# Patient Record
Sex: Male | Born: 1995 | Hispanic: No | Marital: Single | Smoking: Never smoker
Health system: Southern US, Community
[De-identification: ages and names within clinical notes are randomized; demographics above are authoritative.]

## PROBLEM LIST (undated history)

## (undated) DIAGNOSIS — E119 Type 2 diabetes mellitus without complications: Secondary | ICD-10-CM

## (undated) HISTORY — DX: Type 2 diabetes mellitus without complications: E11.9

---

## 2020-07-11 ENCOUNTER — Encounter (HOSPITAL_COMMUNITY): Payer: Self-pay | Admitting: Emergency Medicine

## 2020-07-11 ENCOUNTER — Other Ambulatory Visit: Payer: Self-pay

## 2020-07-11 ENCOUNTER — Inpatient Hospital Stay (HOSPITAL_COMMUNITY)
Admission: EM | Admit: 2020-07-11 | Discharge: 2020-07-15 | DRG: 638 | Disposition: A | Discharge status: Home / Self Care | Payer: Medicaid Other | Attending: Internal Medicine | Admitting: Internal Medicine

## 2020-07-11 ENCOUNTER — Emergency Department (HOSPITAL_COMMUNITY): Payer: Medicaid Other

## 2020-07-11 DIAGNOSIS — R079 Chest pain, unspecified: Secondary | ICD-10-CM

## 2020-07-11 DIAGNOSIS — E669 Obesity, unspecified: Secondary | ICD-10-CM | POA: Diagnosis present

## 2020-07-11 DIAGNOSIS — D72828 Other elevated white blood cell count: Secondary | ICD-10-CM | POA: Diagnosis present

## 2020-07-11 DIAGNOSIS — Z833 Family history of diabetes mellitus: Secondary | ICD-10-CM

## 2020-07-11 DIAGNOSIS — E1010 Type 1 diabetes mellitus with ketoacidosis without coma: Principal | ICD-10-CM | POA: Diagnosis present

## 2020-07-11 DIAGNOSIS — R3129 Other microscopic hematuria: Secondary | ICD-10-CM | POA: Diagnosis present

## 2020-07-11 DIAGNOSIS — Z6837 Body mass index (BMI) 37.0-37.9, adult: Secondary | ICD-10-CM

## 2020-07-11 DIAGNOSIS — E876 Hypokalemia: Secondary | ICD-10-CM | POA: Diagnosis present

## 2020-07-11 DIAGNOSIS — E860 Dehydration: Secondary | ICD-10-CM | POA: Diagnosis present

## 2020-07-11 DIAGNOSIS — Z8616 Personal history of COVID-19: Secondary | ICD-10-CM

## 2020-07-11 DIAGNOSIS — N179 Acute kidney failure, unspecified: Secondary | ICD-10-CM | POA: Diagnosis present

## 2020-07-11 DIAGNOSIS — E871 Hypo-osmolality and hyponatremia: Secondary | ICD-10-CM | POA: Diagnosis present

## 2020-07-11 DIAGNOSIS — E1110 Type 2 diabetes mellitus with ketoacidosis without coma: Secondary | ICD-10-CM | POA: Diagnosis present

## 2020-07-11 LAB — CBC WITH DIFFERENTIAL/PLATELET
Abs Immature Granulocytes: 0.16 10*3/uL — ABNORMAL HIGH (ref 0.00–0.07)
Basophils Absolute: 0.1 10*3/uL (ref 0.0–0.1)
Basophils Relative: 0 %
Eosinophils Absolute: 0 10*3/uL (ref 0.0–0.5)
Eosinophils Relative: 0 %
HCT: 51.8 % (ref 39.0–52.0)
Hemoglobin: 17 g/dL (ref 13.0–17.0)
Immature Granulocytes: 1 %
Lymphocytes Relative: 9 %
Lymphs Abs: 1.5 10*3/uL (ref 0.7–4.0)
MCH: 27.7 pg (ref 26.0–34.0)
MCHC: 32.8 g/dL (ref 30.0–36.0)
MCV: 84.4 fL (ref 80.0–100.0)
Monocytes Absolute: 0.8 10*3/uL (ref 0.1–1.0)
Monocytes Relative: 5 %
Neutro Abs: 14.4 10*3/uL — ABNORMAL HIGH (ref 1.7–7.7)
Neutrophils Relative %: 85 %
Platelets: 347 10*3/uL (ref 150–400)
RBC: 6.14 MIL/uL — ABNORMAL HIGH (ref 4.22–5.81)
RDW: 13.4 % (ref 11.5–15.5)
WBC: 17 10*3/uL — ABNORMAL HIGH (ref 4.0–10.5)
nRBC: 0 % (ref 0.0–0.2)

## 2020-07-11 LAB — MAGNESIUM: Magnesium: 2.6 mg/dL — ABNORMAL HIGH (ref 1.7–2.4)

## 2020-07-11 LAB — I-STAT VENOUS BLOOD GAS, ED
Acid-base deficit: 28 mmol/L — ABNORMAL HIGH (ref 0.0–2.0)
Bicarbonate: 3.4 mmol/L — ABNORMAL LOW (ref 20.0–28.0)
Calcium, Ion: 1.16 mmol/L (ref 1.15–1.40)
HCT: 55 % — ABNORMAL HIGH (ref 39.0–52.0)
Hemoglobin: 18.7 g/dL — ABNORMAL HIGH (ref 13.0–17.0)
O2 Saturation: 81 %
Potassium: 4 mmol/L (ref 3.5–5.1)
Sodium: 127 mmol/L — ABNORMAL LOW (ref 135–145)
TCO2: <5 mmol/L — ABNORMAL LOW (ref 22–32)
pCO2, Ven: 16.6 mmHg — CL (ref 44.0–60.0)
pH, Ven: 6.914 — CL (ref 7.250–7.430)
pO2, Ven: 73 mmHg — ABNORMAL HIGH (ref 32.0–45.0)

## 2020-07-11 LAB — CBC
HCT: 52.3 % — ABNORMAL HIGH (ref 39.0–52.0)
HCT: 47.3 % (ref 39.0–52.0)
Hemoglobin: 17.1 g/dL — ABNORMAL HIGH (ref 13.0–17.0)
Hemoglobin: 15.9 g/dL (ref 13.0–17.0)
MCH: 27.7 pg (ref 26.0–34.0)
MCH: 28.2 pg (ref 26.0–34.0)
MCHC: 32.7 g/dL (ref 30.0–36.0)
MCHC: 33.6 g/dL (ref 30.0–36.0)
MCV: 84.8 fL (ref 80.0–100.0)
MCV: 84 fL (ref 80.0–100.0)
Platelets: 330 10*3/uL (ref 150–400)
Platelets: 296 10*3/uL (ref 150–400)
RBC: 6.17 MIL/uL — ABNORMAL HIGH (ref 4.22–5.81)
RBC: 5.63 MIL/uL (ref 4.22–5.81)
RDW: 13.4 % (ref 11.5–15.5)
RDW: 13.2 % (ref 11.5–15.5)
WBC: 16.6 10*3/uL — ABNORMAL HIGH (ref 4.0–10.5)
WBC: 21.1 10*3/uL — ABNORMAL HIGH (ref 4.0–10.5)
nRBC: 0 % (ref 0.0–0.2)
nRBC: 0 % (ref 0.0–0.2)

## 2020-07-11 LAB — RAPID URINE DRUG SCREEN, HOSP PERFORMED
Amphetamines: NOT DETECTED
Barbiturates: NOT DETECTED
Benzodiazepines: NOT DETECTED
Cocaine: NOT DETECTED
Opiates: NOT DETECTED
Tetrahydrocannabinol: NOT DETECTED

## 2020-07-11 LAB — CBG MONITORING, ED
Glucose-Capillary: 476 mg/dL — ABNORMAL HIGH (ref 70–99)
Glucose-Capillary: 504 mg/dL (ref 70–99)
Glucose-Capillary: 531 mg/dL (ref 70–99)
Glucose-Capillary: 534 mg/dL (ref 70–99)
Glucose-Capillary: 458 mg/dL — ABNORMAL HIGH (ref 70–99)

## 2020-07-11 LAB — URINALYSIS, ROUTINE W REFLEX MICROSCOPIC
Bacteria, UA: NONE SEEN
Bilirubin Urine: NEGATIVE
Glucose, UA: >=500 mg/dL — AB
Ketones, ur: 80 mg/dL — AB
Leukocytes,Ua: NEGATIVE
Nitrite: NEGATIVE
Protein, ur: 100 mg/dL — AB
Specific Gravity, Urine: 1.017 (ref 1.005–1.030)
pH: 5 (ref 5.0–8.0)

## 2020-07-11 LAB — RESP PANEL BY RT-PCR (FLU A&B, COVID) ARPGX2
Influenza A by PCR: NEGATIVE
Influenza B by PCR: NEGATIVE
SARS Coronavirus 2 by RT PCR: NEGATIVE

## 2020-07-11 LAB — BASIC METABOLIC PANEL
BUN: 21 mg/dL — ABNORMAL HIGH (ref 6–20)
BUN: 22 mg/dL — ABNORMAL HIGH (ref 6–20)
CO2: <7 mmol/L — ABNORMAL LOW (ref 22–32)
CO2: <7 mmol/L — ABNORMAL LOW (ref 22–32)
Calcium: 8.9 mg/dL (ref 8.9–10.3)
Calcium: 9 mg/dL (ref 8.9–10.3)
Chloride: 98 mmol/L (ref 98–111)
Chloride: 99 mmol/L (ref 98–111)
Creatinine, Ser: 1.52 mg/dL — ABNORMAL HIGH (ref 0.61–1.24)
Creatinine, Ser: 1.53 mg/dL — ABNORMAL HIGH (ref 0.61–1.24)
GFR, Estimated: >60 mL/min (ref >60)
GFR, Estimated: >60 mL/min (ref >60)
Glucose, Bld: 488 mg/dL — ABNORMAL HIGH (ref 70–99)
Glucose, Bld: 506 mg/dL (ref 70–99)
Potassium: 4.2 mmol/L (ref 3.5–5.1)
Potassium: 3.8 mmol/L (ref 3.5–5.1)
Sodium: 125 mmol/L — ABNORMAL LOW (ref 135–145)
Sodium: 126 mmol/L — ABNORMAL LOW (ref 135–145)

## 2020-07-11 LAB — GLUCOSE, CAPILLARY: Glucose-Capillary: 409 mg/dL — ABNORMAL HIGH (ref 70–99)

## 2020-07-11 LAB — TSH: TSH: 0.7 u[IU]/mL (ref 0.350–4.500)

## 2020-07-11 LAB — BETA-HYDROXYBUTYRIC ACID: Beta-Hydroxybutyric Acid: >8 mmol/L — ABNORMAL HIGH (ref 0.05–0.27)

## 2020-07-11 LAB — HEMOGLOBIN A1C
Hgb A1c MFr Bld: 12.8 % — ABNORMAL HIGH (ref 4.8–5.6)
Mean Plasma Glucose: 320.66 mg/dL

## 2020-07-11 LAB — TROPONIN I (HIGH SENSITIVITY): Troponin I (High Sensitivity): 5 ng/L (ref <18)

## 2020-07-11 LAB — D-DIMER, QUANTITATIVE: D-Dimer, Quant: 0.42 ug/mL-FEU (ref 0.00–0.50)

## 2020-07-11 LAB — PHOSPHORUS: Phosphorus: 1.8 mg/dL — ABNORMAL LOW (ref 2.5–4.6)

## 2020-07-11 MED ORDER — POTASSIUM CHLORIDE 10 MEQ/100ML IV SOLN
10.0000 meq | INTRAVENOUS | Status: DC
  Administered 2020-07-11: 10 meq via INTRAVENOUS
  Filled 2020-07-11 (×2): qty 100

## 2020-07-11 MED ORDER — LACTATED RINGERS IV SOLN: INTRAVENOUS | Status: DC

## 2020-07-11 MED ORDER — INSULIN REGULAR(HUMAN) IN NACL 100-0.9 UT/100ML-% IV SOLN
INTRAVENOUS | Status: DC
  Administered 2020-07-12: 8.5 [IU]/h via INTRAVENOUS
  Administered 2020-07-12: 11.5 [IU]/h via INTRAVENOUS
  Administered 2020-07-13: 5 [IU]/h via INTRAVENOUS
  Filled 2020-07-11 (×3): qty 100

## 2020-07-11 MED ORDER — STERILE WATER FOR INJECTION IV SOLN
INTRAVENOUS | Status: DC
  Filled 2020-07-11 (×3): qty 850

## 2020-07-11 MED ORDER — ENOXAPARIN SODIUM 60 MG/0.6ML ~~LOC~~ SOLN
60.0000 mg | SUBCUTANEOUS | Status: DC
  Administered 2020-07-11 – 2020-07-12 (×2): 60 mg via SUBCUTANEOUS
  Filled 2020-07-11 (×3): qty 0.6

## 2020-07-11 MED ORDER — POTASSIUM CHLORIDE 10 MEQ/100ML IV SOLN: 10.0000 meq | INTRAVENOUS | Status: DC

## 2020-07-11 MED ORDER — LACTATED RINGERS IV BOLUS
20.0000 mL/kg | Freq: Once | INTRAVENOUS | Status: AC
  Administered 2020-07-11: 2450 mL via INTRAVENOUS

## 2020-07-11 MED ORDER — POTASSIUM CHLORIDE 10 MEQ/100ML IV SOLN
10.0000 meq | INTRAVENOUS | Status: DC
  Administered 2020-07-11: 10 meq via INTRAVENOUS
  Filled 2020-07-11: qty 100

## 2020-07-11 MED ORDER — LORAZEPAM 2 MG/ML IJ SOLN
1.0000 mg | Freq: Once | INTRAMUSCULAR | Status: AC
  Administered 2020-07-11: 1 mg via INTRAVENOUS
  Filled 2020-07-11: qty 1

## 2020-07-11 MED ORDER — DEXTROSE IN LACTATED RINGERS 5 % IV SOLN: INTRAVENOUS | Status: DC

## 2020-07-11 MED ORDER — INSULIN REGULAR(HUMAN) IN NACL 100-0.9 UT/100ML-% IV SOLN
INTRAVENOUS | Status: DC
  Administered 2020-07-11: 6.5 [IU]/h via INTRAVENOUS
  Filled 2020-07-11: qty 100

## 2020-07-11 MED ORDER — DEXTROSE 50 % IV SOLN: 0.0000 mL | INTRAVENOUS | Status: DC | PRN

## 2020-07-11 MED ORDER — SODIUM CHLORIDE 0.9 % IV SOLN
1.0000 g | INTRAVENOUS | Status: DC
  Administered 2020-07-12: 1 g via INTRAVENOUS
  Filled 2020-07-11 (×3): qty 10

## 2020-07-11 MED ORDER — DEXTROSE 50 % IV SOLN
0.0000 mL | INTRAVENOUS | Status: DC | PRN
Start: 2020-07-11 — End: 2020-07-11
  Administered 2020-07-11: 50 mL via INTRAVENOUS
  Filled 2020-07-11: qty 50

## 2020-07-11 MED ORDER — SODIUM CHLORIDE 0.9 % IV BOLUS
1000.0000 mL | Freq: Once | INTRAVENOUS | Status: AC
  Administered 2020-07-11: 1000 mL via INTRAVENOUS

## 2020-07-11 MED ORDER — CHLORHEXIDINE GLUCONATE CLOTH 2 % EX PADS
6.0000 | MEDICATED_PAD | Freq: Every day | CUTANEOUS | Status: DC
  Administered 2020-07-12 – 2020-07-15 (×4): 6 via TOPICAL

## 2020-07-11 NOTE — ED Triage Notes (Addendum)
Pt reports pain to center of chest and SOB x 4 days.  Quit drinking heavy ETOH 22 days ago.  Pt tachypnic and tachycardic @ 140.Marland Kitchen  Skin mottled.  Oral and axillary temp not reading at triage.

## 2020-07-11 NOTE — Progress Notes (Addendum)
Per ED RN, patient received two bags of potassium. There was an ER order for two bags to be given, however, CCM placed an order for four bags to be given. ED RN scanned the first bag in under the ER order set (1 out of 2) and the second bag from the CCM order set (1 out of 4).   This lead to confusion on how many total bags of potassium the patient was suppose to receive. After calling [facility], I spoke with Marlane Mingle, MD in regards to the orders. Per Marlane Mingle, MD, "only give two bags of potassium at this time, and then follow up based on BMP results".

## 2020-07-11 NOTE — ED Provider Notes (Signed)
MSE was initiated and I personally evaluated the patient and placed orders (if any) at  6:15 PM on July 11, 2020.  The patient appears stable so that the remainder of the MSE may be completed by another provider.  Patient report history of heavy alcohol use, quit 22 days ago, report 4 days of recurrent chest pain shortness of breath.  Denies any drug use.  Denies any history of thyroid disease.  Patient appears anxious, skin is mottled, he is tachycardic.   Fayrene Helper, PA-C 07/11/20 1817    Koleen Distance, MD 07/11/20 [phone]

## 2020-07-11 NOTE — ED Provider Notes (Signed)
[facility]   CSN: [id number] Arrival date & time: 07/11/20  1759     History Chief Complaint  Patient presents with  . Chest Pain  . Shortness of Breath    Ian Carrillo is a 25 y.o. male.  HPI Patient is a 25 year old male presented today significantly short of breath and dyspneic.  He was breathing rapidly and unable to provide more than brief 1-5 word sentences.  He communicates that he is having some chest tightness and pressure, feeling very short of breath he states that his symptoms have been ongoing for approximately 1 week became severely rapidly worse over the past several hours.  Level 5 caveat due to acuity of condition  I discussed with patient's partner who states the patient has been drinking large amounts of water and peeing frequently for the past week or 2.  No history of diabetes in the family.  Patient denies any fevers, chills, lightheadedness or dizziness.  He does not for me that he quit drinking heavy alcohol approximately 22 days ago.  He had some mild withdrawal symptoms of anxiety and ill temper but seems to have improved.    History reviewed. No pertinent past medical history.  Patient Active Problem List   Diagnosis Date Noted  . DKA (diabetic ketoacidosis) (HCC) 07/11/2020    History reviewed. No pertinent surgical history.     No family history on file.  Social History   Tobacco Use  . Smoking status: Never Smoker  . Smokeless tobacco: Never Used  Substance Use Topics  . Alcohol use: Yes    Comment: quit 22 days ago  . Drug use: Not Currently    Home Medications Prior to Admission medications   Not on File    Allergies    Patient has no known allergies.  Review of Systems   Review of Systems  Unable to perform ROS: Severe respiratory distress  Constitutional: Positive for fatigue. Negative for chills and fever.  HENT: Negative for congestion.   Eyes: Negative for pain.   Respiratory: Positive for chest tightness and shortness of breath. Negative for cough.   Cardiovascular: Negative for chest pain and leg swelling.  Gastrointestinal: Negative for abdominal pain and vomiting.  Genitourinary: Positive for frequency.  Musculoskeletal: Negative for myalgias.  Skin: Negative for rash.  Neurological: Negative for dizziness and headaches.    Physical Exam Updated Vital Signs BP (!) 154/94   Pulse (!) 122   Temp (!) 97.4 F (36.3 C) (Oral)   Resp (!) 27   Ht 5\' 7"  (1.702 m)   Wt 122.5 kg   SpO2 100%   BMI 42.29 kg/m   Physical Exam Vitals and nursing note reviewed.  Constitutional:      General: He is in acute distress.     Appearance: He is ill-appearing, toxic-appearing and diaphoretic.     Comments: Patient is alert obese 25 year old male is rapidly breathing shallowly.  Increased work of breathing.  Significant tachypnea rate approximately 40-42  HENT:     Head: Normocephalic and atraumatic.     Nose: Nose normal.     Mouth/Throat:     Mouth: Mucous membranes are dry.     Comments: Mouth is severely dry.  Oral mucosa is dry to touch. Eyes:     General: No scleral icterus. Cardiovascular:     Rate and Rhythm: Normal rate and regular rhythm.     Pulses: Normal pulses.     Heart sounds: Normal  heart sounds.  Pulmonary:     Effort: Pulmonary effort is normal. No respiratory distress.     Breath sounds: Normal breath sounds. No wheezing.     Comments: Lungs are clear to auscultation all fields.  No wheezes or rhonchi.  Good air movement.  Tachypnea. Abdominal:     Palpations: Abdomen is soft.     Tenderness: There is no abdominal tenderness. There is no right CVA tenderness, left CVA tenderness, guarding or rebound.     Hernia: No hernia is present.  Musculoskeletal:     Cervical back: Normal range of motion.     Right lower leg: No edema.     Left lower leg: No edema.  Skin:    General: Skin is warm.     Capillary Refill: Capillary  refill takes less than 2 seconds.  Neurological:     Mental Status: He is alert. Mental status is at baseline.  Psychiatric:        Mood and Affect: Mood normal.        Behavior: Behavior normal.     ED Results / Procedures / Treatments   Labs (all labs ordered are listed, but only abnormal results are displayed) Labs Reviewed  BASIC METABOLIC PANEL - Abnormal; Notable for the following components:      Result Value   Sodium 125 (*)    CO2 <7 (*)    Glucose, Bld 488 (*)    BUN 21 (*)    Creatinine, Ser 1.52 (*)    All other components within normal limits  CBC - Abnormal; Notable for the following components:   WBC 16.6 (*)    RBC 6.17 (*)    Hemoglobin 17.1 (*)    HCT 52.3 (*)    All other components within normal limits  BETA-HYDROXYBUTYRIC ACID - Abnormal; Notable for the following components:   Beta-Hydroxybutyric Acid >8.00 (*)    All other components within normal limits  BASIC METABOLIC PANEL - Abnormal; Notable for the following components:   Sodium 126 (*)    CO2 <7 (*)    Glucose, Bld 506 (*)    BUN 22 (*)    Creatinine, Ser 1.53 (*)    All other components within normal limits  CBC WITH DIFFERENTIAL/PLATELET - Abnormal; Notable for the following components:   WBC 17.0 (*)    RBC 6.14 (*)    Neutro Abs 14.4 (*)    Abs Immature Granulocytes 0.16 (*)    All other components within normal limits  URINALYSIS, ROUTINE W REFLEX MICROSCOPIC - Abnormal; Notable for the following components:   Color, Urine STRAW (*)    Glucose, UA >=500 (*)    Hgb urine dipstick MODERATE (*)    Ketones, ur 80 (*)    Protein, ur 100 (*)    All other components within normal limits  CBC - Abnormal; Notable for the following components:   WBC 21.1 (*)    All other components within normal limits  BASIC METABOLIC PANEL - Abnormal; Notable for the following components:   Sodium 133 (*)    CO2 <7 (*)    Glucose, Bld 380 (*)    BUN 21 (*)    Creatinine, Ser 1.34 (*)    Calcium 8.2  (*)    All other components within normal limits  BETA-HYDROXYBUTYRIC ACID - Abnormal; Notable for the following components:   Beta-Hydroxybutyric Acid >8.00 (*)    All other components within normal limits  HEMOGLOBIN A1C - Abnormal; Notable for  the following components:   Hgb A1c MFr Bld 12.8 (*)    All other components within normal limits  LACTIC ACID, PLASMA - Abnormal; Notable for the following components:   Lactic Acid, Venous 3.2 (*)    All other components within normal limits  MAGNESIUM - Abnormal; Notable for the following components:   Magnesium 2.6 (*)    All other components within normal limits  PHOSPHORUS - Abnormal; Notable for the following components:   Phosphorus 1.8 (*)    All other components within normal limits  GLUCOSE, CAPILLARY - Abnormal; Notable for the following components:   Glucose-Capillary 409 (*)    All other components within normal limits  I-STAT VENOUS BLOOD GAS, ED - Abnormal; Notable for the following components:   pH, Ven 6.914 (*)    pCO2, Ven 16.6 (*)    pO2, Ven 73.0 (*)    Bicarbonate 3.4 (*)    TCO2 <5 (*)    Acid-base deficit 28.0 (*)    Sodium 127 (*)    HCT 55.0 (*)    Hemoglobin 18.7 (*)    All other components within normal limits  CBG MONITORING, ED - Abnormal; Notable for the following components:   Glucose-Capillary 476 (*)    All other components within normal limits  CBG MONITORING, ED - Abnormal; Notable for the following components:   Glucose-Capillary 504 (*)    All other components within normal limits  CBG MONITORING, ED - Abnormal; Notable for the following components:   Glucose-Capillary 531 (*)    All other components within normal limits  CBG MONITORING, ED - Abnormal; Notable for the following components:   Glucose-Capillary 534 (*)    All other components within normal limits  CBG MONITORING, ED - Abnormal; Notable for the following components:   Glucose-Capillary 458 (*)    All other components within  normal limits  RESP PANEL BY RT-PCR (FLU A&B, COVID) ARPGX2  MRSA PCR SCREENING  D-DIMER, QUANTITATIVE  RAPID URINE DRUG SCREEN, HOSP PERFORMED  TSH  BLOOD GAS, VENOUS  BASIC METABOLIC PANEL  BASIC METABOLIC PANEL  BETA-HYDROXYBUTYRIC ACID  HIV ANTIBODY (ROUTINE TESTING W REFLEX)  BASIC METABOLIC PANEL  BASIC METABOLIC PANEL  BASIC METABOLIC PANEL  BETA-HYDROXYBUTYRIC ACID  LACTIC ACID, PLASMA  BASIC METABOLIC PANEL  BETA-HYDROXYBUTYRIC ACID  BASIC METABOLIC PANEL  BETA-HYDROXYBUTYRIC ACID  I-STAT VENOUS BLOOD GAS, ED  TROPONIN I (HIGH SENSITIVITY)  TROPONIN I (HIGH SENSITIVITY)    EKG None  Radiology DG Chest 2 View  Result Date: 07/11/2020 CLINICAL DATA:  Chest pain.  Shortness of breath. EXAM: CHEST - 2 VIEW COMPARISON:  None. FINDINGS: The heart size and mediastinal contours are within normal limits. Both lungs are clear. No visible pleural effusions or pneumothorax. No acute osseous abnormality. IMPRESSION: No active cardiopulmonary disease. Electronically Signed   By: Feliberto Harts MD   On: 07/11/2020 18:29    Procedures .Critical Care Performed by: Gailen Shelter, PA Authorized by: Gailen Shelter, PA   Critical care provider statement:    Critical care time (minutes):  45   Critical care was necessary to treat or prevent imminent or life-threatening deterioration of the following conditions:  Metabolic crisis   Critical care was time spent personally by me on the following activities:  Discussions with consultants, evaluation of patient's response to treatment, examination of patient, ordering and performing treatments and interventions, ordering and review of laboratory studies, ordering and review of radiographic studies, pulse oximetry, re-evaluation of patient's condition, obtaining  history from patient or surrogate and review of old charts     Medications Ordered in ED Medications  lactated ringers infusion (0 mLs Intravenous Stopped 07/11/20 2250)   dextrose 5 % in lactated ringers infusion ( Intravenous Stopped 07/11/20 2024)  sodium bicarbonate 150 mEq in sterile water 1,000 mL infusion ( Intravenous Infusion Verify 07/11/20 2300)  enoxaparin (LOVENOX) injection 60 mg (60 mg Subcutaneous Given 07/11/20 2144)  insulin regular, human (MYXREDLIN) 100 units/ 100 mL infusion ( Intravenous Infusion Verify 07/11/20 2300)  lactated ringers infusion ( Intravenous New Bag/Given 07/11/20 2355)  dextrose 5 % in lactated ringers infusion (has no administration in time range)  dextrose 50 % solution 0-50 mL (has no administration in time range)  cefTRIAXone (ROCEPHIN) 1 g in sodium chloride 0.9 % 100 mL IVPB (has no administration in time range)  Chlorhexidine Gluconate Cloth 2 % PADS 6 each (has no administration in time range)  LORazepam (ATIVAN) injection 1 mg (1 mg Intravenous Given 07/11/20 1859)  sodium chloride 0.9 % bolus 1,000 mL (1,000 mLs Intravenous New Bag/Given 07/11/20 1900)  lactated ringers bolus 2,450 mL (2,450 mLs Intravenous New Bag/Given 07/11/20 1949)    ED Course  I have reviewed the triage vital signs and the nursing notes.  Pertinent labs & imaging results that were available during my care of the patient were reviewed by me and considered in my medical decision making (see chart for details).  Patient is 25 year old male severely ill toxic appearing and tachypneic, tachycardic with very dry oral mucosa and using accessory muscle usage with difficulty speaking due to his dyspnea.  Patient has had lab work obtained in triage.  D-dimer is within normal limits.  This makes my clinical suspicion for PE relatively low--although this is certainly a consideration given his rapid breathing clear lungs and tachycardia.  Patient has dry oral mucosa and Kussmaul breathing will obtain CBG.  CBG significantly elevated.  Suspect DKA.  Will provide patient with 1 L normal saline bolus.  As soon as potassium results will initiate insulin.    Clinical  Course as of 07/12/20 0022  Sat Jul 11, 2020  1841 Discussed with patient's fianc Crystal who states the patient has been drinking lots of water and urinating frequently I have very high suspicion for DKA.  Will obtain CBG.  CBG elevated at 476.  Patient has Kussmaul breathing.  Will provide with IV fluid boluses and eval K+ before insulin initiation  [WF]  2002 Discussed with PCCM who will evaluate pt at bedside.  [WF]    Clinical Course User Index [WF] Gailen Shelter, PA   CBG notable for appropriate hyponatremia given his hyperglycemia.  BUN and creatinine are elevated likely from significant dehydration.  Bicarb on BMP is undetectably low.  Anion gap not calculated.  I personally calculated bicarb to be at least 20 given that bicarb is less than 7 I calculated it as if bicarb was 7.  CBC with leukocytosis likely from dehydration given that there is erythrocytosis as well.  TSH within normal limits.  Troponin within normal limits.  D-dimer unremarkable and within normal limits.  Covid test negative pH is 6.9 will initiate bicarb.  I discussed the case my attending physician who is agreeable with plan.  Urine drug screen negative for recreational drugs.  Urinalysis with ketones moderate hemoglobin perhaps due to stress.  Glucosuria  Consulted PCCM who will admit.  MDM Rules/Calculators/A&P  Final Clinical Impression(s) / ED Diagnoses Final diagnoses:  Diabetic ketoacidosis without coma associated with type 1 diabetes mellitus [facility])    Rx / DC Orders ED Discharge Orders    None       Gailen Shelter, [place] 07/12/20 0022    Cheryll Cockayne, MD 07/12/20 1527

## 2020-07-11 NOTE — H&P (Signed)
NAME:  Ian Carrillo, MRN:  [id number], DOB:  [birth year], LOS: 0 ADMISSION DATE:  07/11/2020,   History of Present Illness:  This is a 25 year old male that presented to the emergency room from home.  He presented with significant dyspnea/shortness of breath and generalized lethargy.  Patient's had significant polydipsia and polyuria for approximately 20 days.  Over the last 10 days he has lost approximately 10 pounds in his eaten very little over the past 3 days.  22 days ago the patient stopped drinking all alcohol and had been drinking a significant amount on a daily basis.  Fifth of alcohol would last approximately 2 to 3 days.  Patient denies any fever sweats or chills.  No cough, sore throat, rhinorrhea, nausea, vomiting, diarrhea.  Positive for generalized lethargy and fatigue.  The patient has a family history of his maternal grandmother is type I diabetic.  Patient did have Covid in December 2021.  Pertinent  Medical History  None  Significant Hospital Events: Including procedures, antibiotic start and stop dates in addition to other pertinent events   .   Interim History / Subjective:    Objective   Blood pressure (!) 159/103, pulse (!) 125, resp. rate (!) 28, height 5\' 7"  (1.702 m), weight 122.5 kg, SpO2 100 %.       No intake or output data in the 24 hours ending 07/11/20 2115 Filed Weights   07/11/20 1902  Weight: 122.5 kg    Examination: General: Moderate tachypnea.  Generalized lethargy. HENT: Atraumatic/normocephalic mucous membranes are very dry. Lungs: Clear to auscultation bilaterally.  No wheezing rales or rhonchi noted. Cardiovascular: Regular rate mildly tachycardic.   Abdomen: Soft, nontender, not distended, no rebound/rigidity/guarding. Extremities: Distal pulse intact x4.  No significant edema.  No cyanosis.  Patient does have a mottling type rash of the inner thighs bilaterally.  This is nonblanchable. Neuro: Patient is lethargic but answers questions  appropriately.  Generally weak.  But no focal neurologic deficits.   Labs/imaging that I havepersonally reviewed  (right click and "Reselect all SmartList Selections" daily)    Resolved Hospital Problem list     Assessment & Plan:  DKA New diagnosed type I diabetic  Plan: Patient be admitted to the intensive care unit for further work-up. DKA order set is be initiated. Insulin infusion started. Strictly n.p.o. Patient is severely acidotic and symptomatic.  A sodium bicarbonate infusion has been started.  Patient did receive 2.4 L LR bolus and will be continued on LR infusion. Monitor I's/O's. Covid testing is negative.  Discussed the case with patient's mother and girlfriend at the bedside.  Best practice (right click and "Reselect all SmartList Selections" daily)  Diet:  NPO Pain/Anxiety/Delirium protocol (if indicated): No VAP protocol (if indicated): Not indicated DVT prophylaxis: LMWH GI prophylaxis: PPI-Protonix Glucose control: DKA order set Central venous access:  N/A Arterial line:  N/A Foley:  N/A Mobility:  bed rest  PT consulted: N/A Last date of multidisciplinary goals of care discussion [ Code Status:  full code Disposition: Admit to the intensive care unit  Labs    CBC: Recent Labs  Lab 07/11/20 1820 07/11/20 1853 07/11/20 1909  WBC 16.6* 17.0*  --   NEUTROABS  --  14.4*  --   HGB 17.1* 17.0 18.7*  HCT 52.3* 51.8 55.0*  MCV 84.8 84.4  --   PLT 330 347  --     Basic Metabolic Panel: Recent Labs  Lab 07/11/20 1820 07/11/20 1853 07/11/20 1909  NA 125*  126* 127*  K 4.2 3.8 4.0  CL 98 99  --   CO2 <7* <7*  --   GLUCOSE 488* 506*  --   BUN 21* 22*  --   CREATININE 1.52* 1.53*  --   CALCIUM 8.9 9.0  --    GFR: Estimated Creatinine Clearance: 94.2 mL/min (A) (by C-G formula based on SCr of 1.53 mg/dL (H)). Recent Labs  Lab 07/11/20 1820 07/11/20 1853  WBC 16.6* 17.0*    Liver Function Tests: No results for input(s): AST, ALT,  ALKPHOS, BILITOT, PROT, ALBUMIN in the last 168 hours. No results for input(s): LIPASE, AMYLASE in the last 168 hours. No results for input(s): AMMONIA in the last 168 hours.  ABG    Component Value Date/Time   HCO3 3.4 (L) 07/11/2020 1909   TCO2 <5 (L) 07/11/2020 1909   ACIDBASEDEF 28.0 (H) 07/11/2020 1909   O2SAT 81.0 07/11/2020 1909     Coagulation Profile: No results for input(s): INR, PROTIME in the last 168 hours.  Cardiac Enzymes: No results for input(s): CKTOTAL, CKMB, CKMBINDEX, TROPONINI in the last 168 hours.  HbA1C: No results found for: HGBA1C  CBG: Recent Labs  Lab 07/11/20 1853 07/11/20 1957 07/11/20 2032 07/11/20 2109  GLUCAP 476* 504* 531* 534*    Review of Systems:   See HPI  Past Medical History:  He,  has no past medical history on file.   Surgical History:  History reviewed. No pertinent surgical history.   Social History:   reports that he has never smoked. He has never used smokeless tobacco. He reports current alcohol use. He reports previous drug use.   Family History:  His family history is not on file.   Allergies Not on File   Home Medications  Prior to Admission medications   Not on File     Critical care time: 

## 2020-07-11 NOTE — Progress Notes (Signed)
eLink Physician-Brief Progress Note Patient Name: Ian Carrillo DOB: [birth year] MRN: [id number]   Date of Service  07/11/2020  HPI/Events of Note  DKA On insulin and bicarb gtt. Seen by Dr Nicole Cella already. Kcl replaced by ED . Has Kcl 10 meq x q1 hr x 4 doses. Last K 4.7  eICU Interventions  - follow BMP. DC ed 38m eq Kcl order for now. If hypokalemia to call back for replacement      Intervention Category Intermediate Interventions: Electrolyte abnormality - evaluation and management  Ranee Gosselin 07/11/2020, 11:18 PM

## 2020-07-11 NOTE — ED Notes (Signed)
This RN attempted to call report x1  

## 2020-07-12 ENCOUNTER — Inpatient Hospital Stay: Payer: Self-pay

## 2020-07-12 DIAGNOSIS — E1010 Type 1 diabetes mellitus with ketoacidosis without coma: Principal | ICD-10-CM

## 2020-07-12 DIAGNOSIS — E876 Hypokalemia: Secondary | ICD-10-CM

## 2020-07-12 DIAGNOSIS — E872 Acidosis: Secondary | ICD-10-CM

## 2020-07-12 DIAGNOSIS — N179 Acute kidney failure, unspecified: Secondary | ICD-10-CM

## 2020-07-12 LAB — BASIC METABOLIC PANEL
Anion gap: 13 (ref 5–15)
Anion gap: 12 (ref 5–15)
Anion gap: 13 (ref 5–15)
BUN: 21 mg/dL — ABNORMAL HIGH (ref 6–20)
BUN: 18 mg/dL (ref 6–20)
BUN: 11 mg/dL (ref 6–20)
BUN: 11 mg/dL (ref 6–20)
BUN: 12 mg/dL (ref 6–20)
CO2: <7 mmol/L — ABNORMAL LOW (ref 22–32)
CO2: <7 mmol/L — ABNORMAL LOW (ref 22–32)
CO2: 11 mmol/L — ABNORMAL LOW (ref 22–32)
CO2: 16 mmol/L — ABNORMAL LOW (ref 22–32)
CO2: 17 mmol/L — ABNORMAL LOW (ref 22–32)
Calcium: 8.2 mg/dL — ABNORMAL LOW (ref 8.9–10.3)
Calcium: 8.4 mg/dL — ABNORMAL LOW (ref 8.9–10.3)
Calcium: 8.5 mg/dL — ABNORMAL LOW (ref 8.9–10.3)
Calcium: 8.3 mg/dL — ABNORMAL LOW (ref 8.9–10.3)
Calcium: 8.7 mg/dL — ABNORMAL LOW (ref 8.9–10.3)
Chloride: 110 mmol/L (ref 98–111)
Chloride: 111 mmol/L (ref 98–111)
Chloride: 110 mmol/L (ref 98–111)
Chloride: 109 mmol/L (ref 98–111)
Chloride: 107 mmol/L (ref 98–111)
Creatinine, Ser: 1.34 mg/dL — ABNORMAL HIGH (ref 0.61–1.24)
Creatinine, Ser: 1.15 mg/dL (ref 0.61–1.24)
Creatinine, Ser: 1.08 mg/dL (ref 0.61–1.24)
Creatinine, Ser: 0.99 mg/dL (ref 0.61–1.24)
Creatinine, Ser: 1 mg/dL (ref 0.61–1.24)
GFR, Estimated: >60 mL/min (ref >60)
GFR, Estimated: >60 mL/min (ref >60)
GFR, Estimated: >60 mL/min (ref >60)
GFR, Estimated: >60 mL/min (ref >60)
GFR, Estimated: >60 mL/min (ref >60)
Glucose, Bld: 380 mg/dL — ABNORMAL HIGH (ref 70–99)
Glucose, Bld: 145 mg/dL — ABNORMAL HIGH (ref 70–99)
Glucose, Bld: 221 mg/dL — ABNORMAL HIGH (ref 70–99)
Glucose, Bld: 271 mg/dL — ABNORMAL HIGH (ref 70–99)
Glucose, Bld: 194 mg/dL — ABNORMAL HIGH (ref 70–99)
Potassium: 3.6 mmol/L (ref 3.5–5.1)
Potassium: 3.5 mmol/L (ref 3.5–5.1)
Potassium: 2.2 mmol/L — CL (ref 3.5–5.1)
Potassium: 2.2 mmol/L — CL (ref 3.5–5.1)
Potassium: 2 mmol/L — CL (ref 3.5–5.1)
Sodium: 133 mmol/L — ABNORMAL LOW (ref 135–145)
Sodium: 136 mmol/L (ref 135–145)
Sodium: 134 mmol/L — ABNORMAL LOW (ref 135–145)
Sodium: 137 mmol/L (ref 135–145)
Sodium: 137 mmol/L (ref 135–145)

## 2020-07-12 LAB — GLUCOSE, CAPILLARY
Glucose-Capillary: 145 mg/dL — ABNORMAL HIGH (ref 70–99)
Glucose-Capillary: 151 mg/dL — ABNORMAL HIGH (ref 70–99)
Glucose-Capillary: 153 mg/dL — ABNORMAL HIGH (ref 70–99)
Glucose-Capillary: 157 mg/dL — ABNORMAL HIGH (ref 70–99)
Glucose-Capillary: 163 mg/dL — ABNORMAL HIGH (ref 70–99)
Glucose-Capillary: 167 mg/dL — ABNORMAL HIGH (ref 70–99)
Glucose-Capillary: 169 mg/dL — ABNORMAL HIGH (ref 70–99)
Glucose-Capillary: 169 mg/dL — ABNORMAL HIGH (ref 70–99)
Glucose-Capillary: 181 mg/dL — ABNORMAL HIGH (ref 70–99)
Glucose-Capillary: 185 mg/dL — ABNORMAL HIGH (ref 70–99)
Glucose-Capillary: 192 mg/dL — ABNORMAL HIGH (ref 70–99)
Glucose-Capillary: 198 mg/dL — ABNORMAL HIGH (ref 70–99)
Glucose-Capillary: 215 mg/dL — ABNORMAL HIGH (ref 70–99)
Glucose-Capillary: 216 mg/dL — ABNORMAL HIGH (ref 70–99)
Glucose-Capillary: 220 mg/dL — ABNORMAL HIGH (ref 70–99)
Glucose-Capillary: 221 mg/dL — ABNORMAL HIGH (ref 70–99)
Glucose-Capillary: 223 mg/dL — ABNORMAL HIGH (ref 70–99)
Glucose-Capillary: 227 mg/dL — ABNORMAL HIGH (ref 70–99)
Glucose-Capillary: 239 mg/dL — ABNORMAL HIGH (ref 70–99)
Glucose-Capillary: 242 mg/dL — ABNORMAL HIGH (ref 70–99)
Glucose-Capillary: 306 mg/dL — ABNORMAL HIGH (ref 70–99)
Glucose-Capillary: 347 mg/dL — ABNORMAL HIGH (ref 70–99)

## 2020-07-12 LAB — BETA-HYDROXYBUTYRIC ACID
Beta-Hydroxybutyric Acid: >8 mmol/L — ABNORMAL HIGH (ref 0.05–0.27)
Beta-Hydroxybutyric Acid: 7.74 mmol/L — ABNORMAL HIGH (ref 0.05–0.27)
Beta-Hydroxybutyric Acid: 5.21 mmol/L — ABNORMAL HIGH (ref 0.05–0.27)

## 2020-07-12 LAB — LACTIC ACID, PLASMA
Lactic Acid, Venous: 3.2 mmol/L (ref 0.5–1.9)
Lactic Acid, Venous: 1.8 mmol/L (ref 0.5–1.9)
Lactic Acid, Venous: 1.5 mmol/L (ref 0.5–1.9)

## 2020-07-12 LAB — MAGNESIUM: Magnesium: 2.3 mg/dL (ref 1.7–2.4)

## 2020-07-12 LAB — HIV ANTIBODY (ROUTINE TESTING W REFLEX): HIV Screen 4th Generation wRfx: NONREACTIVE

## 2020-07-12 LAB — PHOSPHORUS: Phosphorus: <1 mg/dL — CL (ref 2.5–4.6)

## 2020-07-12 LAB — TROPONIN I (HIGH SENSITIVITY): Troponin I (High Sensitivity): 5 ng/L (ref <18)

## 2020-07-12 LAB — MRSA PCR SCREENING: MRSA by PCR: NEGATIVE

## 2020-07-12 MED ORDER — SODIUM CHLORIDE 0.9% FLUSH
10.0000 mL | Freq: Two times a day (BID) | INTRAVENOUS | Status: DC
  Administered 2020-07-12 – 2020-07-13 (×4): 10 mL
  Administered 2020-07-14: 3 mL
  Administered 2020-07-14 – 2020-07-15 (×2): 10 mL

## 2020-07-12 MED ORDER — POTASSIUM CHLORIDE 10 MEQ/100ML IV SOLN: 10.0000 meq | Freq: Once | INTRAVENOUS | Status: DC

## 2020-07-12 MED ORDER — POTASSIUM CHLORIDE CRYS ER 20 MEQ PO TBCR
40.0000 meq | EXTENDED_RELEASE_TABLET | ORAL | Status: DC
  Filled 2020-07-12: qty 2

## 2020-07-12 MED ORDER — POTASSIUM CHLORIDE 10 MEQ/50ML IV SOLN
10.0000 meq | INTRAVENOUS | Status: AC
  Administered 2020-07-12 – 2020-07-13 (×8): 10 meq via INTRAVENOUS
  Filled 2020-07-12 (×6): qty 50

## 2020-07-12 MED ORDER — INSULIN ASPART 100 UNIT/ML ~~LOC~~ SOLN: 0.0000 [IU] | Freq: Every day | SUBCUTANEOUS | Status: DC

## 2020-07-12 MED ORDER — INSULIN ASPART 100 UNIT/ML ~~LOC~~ SOLN: 0.0000 [IU] | Freq: Three times a day (TID) | SUBCUTANEOUS | Status: DC

## 2020-07-12 MED ORDER — POTASSIUM CHLORIDE 10 MEQ/100ML IV SOLN: 10.0000 meq | INTRAVENOUS | Status: DC

## 2020-07-12 MED ORDER — ONDANSETRON HCL 4 MG/2ML IJ SOLN: 4.0000 mg | Freq: Four times a day (QID) | INTRAMUSCULAR | Status: DC | PRN

## 2020-07-12 MED ORDER — INSULIN GLARGINE 100 UNIT/ML ~~LOC~~ SOLN
25.0000 [IU] | Freq: Every day | SUBCUTANEOUS | Status: DC
  Administered 2020-07-12: 25 [IU] via SUBCUTANEOUS
  Filled 2020-07-12 (×2): qty 0.25

## 2020-07-12 MED ORDER — POTASSIUM CHLORIDE CRYS ER 20 MEQ PO TBCR
40.0000 meq | EXTENDED_RELEASE_TABLET | ORAL | Status: DC
  Administered 2020-07-12: 40 meq via ORAL

## 2020-07-12 MED ORDER — POTASSIUM PHOSPHATES 15 MMOLE/5ML IV SOLN
20.0000 mmol | Freq: Once | INTRAVENOUS | Status: AC
  Administered 2020-07-12: 20 mmol via INTRAVENOUS
  Filled 2020-07-12: qty 6.67

## 2020-07-12 MED ORDER — SODIUM CHLORIDE 0.9% FLUSH
10.0000 mL | INTRAVENOUS | Status: DC | PRN
Start: 2020-07-12 — End: 2020-07-15

## 2020-07-12 MED ORDER — POTASSIUM CHLORIDE 10 MEQ/100ML IV SOLN
10.0000 meq | INTRAVENOUS | Status: DC
  Administered 2020-07-12: 10 meq via INTRAVENOUS
  Filled 2020-07-12: qty 100

## 2020-07-12 MED ORDER — POTASSIUM PHOSPHATES 15 MMOLE/5ML IV SOLN
30.0000 mmol | Freq: Once | INTRAVENOUS | Status: AC
  Administered 2020-07-12: 30 mmol via INTRAVENOUS
  Filled 2020-07-12: qty 10

## 2020-07-12 MED ORDER — POTASSIUM CHLORIDE 10 MEQ/100ML IV SOLN
10.0000 meq | INTRAVENOUS | Status: AC
  Administered 2020-07-12 (×4): 10 meq via INTRAVENOUS
  Filled 2020-07-12 (×3): qty 100

## 2020-07-12 NOTE — Progress Notes (Signed)
Shift Summary: q1h BG via [employer], attempted to eat and transition to subq insulin, resulting in emesis and remaining on IV insulin. PICC placement.   N: AAOx4, sleepy, MAE, afebrile.   R: RA, clear lungs  CV: ST 110-120 with improving amounts of ectopy, very frequent PVC and PAC this AM. BP WDL, no pressors.   GI: NPO for majority of shift. Insulin requirements decreasing with low BP this shift at 145. D5LR discontinued and 25 units Glargine given in attempt to transition off IV insulin. Given dinner, near immediate emesis and unable to tolerate even clear liquids. D5LR resumed and will remain on IV insulin overnight. Bicarb gtt discontinued this afternoon.   GU: no foley, uses urinal independently with 1100 UOP this shift.   Skin: clammy, no skin breakdown.   K+ given IVPB this AM for 2.2. Repeat K+ 2.2, PO K+ ordered but unable to tolerate PO. MD notified to switch back to IVPB replacement, will hand off replacement to [facility] RN.   in D5 of K Phos replaced x2 for phosphorus <1.0.   PICC line placed by vascular management/IV team this afternoon for infusion management and unable to obtain labs from peripheral sticks.   Sister and Fiance visiting bedside, mother updated via phone.   Cass Edinger RN

## 2020-07-12 NOTE — Progress Notes (Signed)
eLink Physician-Brief Progress Note Patient Name: Karder Goodin DOB: 21-Oct-1995 MRN: [id number]   Date of Service  07/12/2020  HPI/Events of Note  K and Po4 low. Resolving AKI.  Pco2/hco3 still low at 7. On bicarb gtt. LA 3.2   eICU Interventions  Kphos 20 mmols IVPB Follow LA in AM.  dicussed with bed side rN.      Intervention Category Intermediate Interventions: Electrolyte abnormality - evaluation and management  Ranee Gosselin 07/12/2020, 1:43 AM

## 2020-07-12 NOTE — Progress Notes (Signed)
NAME:  Ian Carrillo, MRN:  [id number], DOB:  [birth year], LOS: 1 ADMISSION DATE:  07/11/2020,   History of Present Illness:  This is a 25 year old male that presented to the emergency room from home.  He presented with significant dyspnea/shortness of breath and generalized lethargy.  Patient's had significant polydipsia and polyuria for approximately 20 days.  Over the last 10 days he has lost approximately 10 pounds in his eaten very little over the past 3 days.  22 days ago the patient stopped drinking all alcohol and had been drinking a significant amount on a daily basis.  Fifth of alcohol would last approximately 2 to 3 days.  Patient denies any fever sweats or chills.  No cough, sore throat, rhinorrhea, nausea, vomiting, diarrhea.  Positive for generalized lethargy and fatigue.  The patient has a family history of his maternal grandmother is type I diabetic.  Patient did have Covid in December 2021.  Pertinent  Medical History  None  Significant Hospital Events: Including procedures, antibiotic start and stop dates in addition to other pertinent events   .   Interim History / Subjective:    Objective   Blood pressure (!) 98/46, pulse (!) 108, temperature 98.2 F (36.8 C), temperature source Oral, resp. rate (!) 22, height 5\' 7"  (1.702 m), weight 104.2 kg, SpO2 100 %.        Intake/Output Summary (Last 24 hours) at 07/12/2020 1411 Last data filed at 07/12/2020 1242 Gross per 24 hour  Intake 4883.43 ml  Output 2710 ml  Net 2173.43 ml   Filed Weights   07/11/20 1902 07/12/20 0430  Weight: 122.5 kg 104.2 kg    Examination: General: Moderate tachypnea.  Generalized lethargy. HENT: Atraumatic/normocephalic mucous membranes are very dry. Lungs: Clear to auscultation bilaterally.  No wheezing rales or rhonchi noted. Cardiovascular: Regular rate mildly tachycardic.   Abdomen: Soft, nontender, not distended, no rebound/rigidity/guarding. Extremities: Distal pulse intact x4.  No  significant edema.  No cyanosis.  Patient does have a mottling type rash of the inner thighs bilaterally.  This is nonblanchable. Neuro: Patient is lethargic but answers questions appropriately.  Generally weak.  But no focal neurologic deficits.   Labs/imaging that I havepersonally reviewed  (right click and "Reselect all SmartList Selections" daily)   Na 134, K 2.2, Cl 110, CO2 11, Glucose 221 AG 13 lactic acid 1.5  Resolved Hospital Problem list     Assessment & Plan:  DKA New diagnosed type I diabetic Hypokalemia Life threatening metabolic acidosis  Plan: Continue insulin gtt, Q4 BMP. Acidosis improving, but still with low bicarb. Patient is NPO until them.  Continue MIVF per protocol, replace electrolytes as needed - needs aggressive potassium replacement Monitor I's/O's. PICC line placed for IVF access and frequent lab draws.  Needs diabetes education   Best practice (right click and "Reselect all SmartList Selections" daily)  Diet:  NPO, will advance once gap is closed Pain/Anxiety/Delirium protocol (if indicated): No VAP protocol (if indicated): Not indicated DVT prophylaxis: LMWH GI prophylaxis: PPI-Protonix Glucose control: DKA order set Central venous access:  N/A Arterial line:  N/A Foley:  N/A Mobility:  bed rest  PT consulted: N/A Last date of multidisciplinary goals of care discussion [4/3] with nurse, patient and patient's mom over the phone Code Status:  full code Disposition: needs ICU  The patient is critically ill due to life threatening metabolic acidosis.  Critical care was necessary to treat or prevent imminent or life-threatening deterioration.  Critical care was time spent personally  by me on the following activities: development of treatment plan with patient and/or surrogate as well as nursing, discussions with consultants, evaluation of patient's response to treatment, examination of patient, obtaining history from patient or surrogate, ordering  and performing treatments and interventions, ordering and review of laboratory studies, ordering and review of radiographic studies, pulse oximetry, re-evaluation of patient's condition and participation in multidisciplinary rounds.   Critical Care Time devoted to patient care services described in this note is 45 minutes. This time reflects time of care of this signee Charlott Holler . This critical care time does not reflect separately billable procedures or procedure time, teaching time or supervisory time of PA/NP/Med student/Med Resident etc but could involve care discussion time.       Charlott Holler  Pulmonary and Critical Care Medicine 07/12/2020 2:12 PM  Pager: see AMION  If no response to pager , please call critical care on call (see AMION) until 7pm After 7:00 pm call Elink      Labs    CBC: Recent Labs  Lab 07/11/20 1820 07/11/20 1853 07/11/20 1909 07/11/20 2239  WBC 16.6* 17.0*  --  21.1*  NEUTROABS  --  14.4*  --   --   HGB 17.1* 17.0 18.7* 15.9  HCT 52.3* 51.8 55.0* 47.3  MCV 84.8 84.4  --  84.0  PLT 330 347  --  296    Basic Metabolic Panel: Recent Labs  Lab 07/11/20 1820 07/11/20 1853 07/11/20 1909 07/11/20 2239 07/12/20 0222 07/12/20 0722 07/12/20 0755  NA 125* 126* 127* 133* 136  --  134*  K 4.2 3.8 4.0 3.6 3.5  --  2.2*  CL 98 99  --  110 111  --  110  CO2 <7* <7*  --  <7* <7*  --  11*  GLUCOSE 488* 506*  --  380* 145*  --  221*  BUN 21* 22*  --  21* 18  --  11  CREATININE 1.52* 1.53*  --  1.34* 1.15  --  1.08  CALCIUM 8.9 9.0  --  8.2* 8.4*  --  8.5*  MG  --   --   --  2.6*  --  2.3  --   PHOS  --   --   --  1.8*  --  <1.0*  --    GFR: Estimated Creatinine Clearance: 122.3 mL/min (by C-G formula based on SCr of 1.08 mg/dL). Recent Labs  Lab 07/11/20 1820 07/11/20 1853 07/11/20 2239 07/12/20 0403 07/12/20 0755  WBC 16.6* 17.0* 21.1*  --   --   LATICACIDVEN  --   --  3.2* 1.8 1.5    Liver Function Tests: No results for  input(s): AST, ALT, ALKPHOS, BILITOT, PROT, ALBUMIN in the last 168 hours. No results for input(s): LIPASE, AMYLASE in the last 168 hours. No results for input(s): AMMONIA in the last 168 hours.  ABG    Component Value Date/Time   HCO3 3.4 (L) 07/11/2020 1909   TCO2 <5 (L) 07/11/2020 1909   ACIDBASEDEF 28.0 (H) 07/11/2020 1909   O2SAT 81.0 07/11/2020 1909     Coagulation Profile: No results for input(s): INR, PROTIME in the last 168 hours.  Cardiac Enzymes: No results for input(s): CKTOTAL, CKMB, CKMBINDEX, TROPONINI in the last 168 hours.  HbA1C: Hgb A1c MFr Bld  Date/Time Value Ref Range Status  07/11/2020 10:39 PM 12.8 (H) 4.8 - 5.6 % Final    Comment:    (NOTE) Pre diabetes:  5.7%-6.4%  Diabetes:              >6.4%  Glycemic control for   <7.0% adults with diabetes     CBG: Recent Labs  Lab 07/12/20 0901 07/12/20 1004 07/12/20 1111 07/12/20 1203 07/12/20 1331  GLUCAP 220* 185* 163* 157* 145*

## 2020-07-12 NOTE — Progress Notes (Signed)
Inpatient Diabetes Program Recommendations  AACE/ADA: New Consensus Statement on Inpatient Glycemic Control (2015)  Target Ranges:  Prepandial:   less than 140 mg/dL      Peak postprandial:   less than 180 mg/dL (1-2 hours)      Critically ill patients:  140 - 180 mg/dL   Lab Results  Component Value Date   GLUCAP 215 (H) 07/12/2020   HGBA1C 12.8 (H) 07/11/2020    Review of Glycemic Control Results for [name], [name] (MRN [id number]) as of 07/12/2020 08:24  Ref. Range 07/12/2020 04:06 07/12/2020 05:03 07/12/2020 06:18 07/12/2020 07:32  Glucose-Capillary Latest Ref Range: 70 - 99 mg/dL 694 (H) 854 (H) 627 (H) 215 (H)   Diabetes history: New diagnosis of DM Current orders for Inpatient glycemic control:  IV insulin-DKA order set  Inpatient Diabetes Program Recommendations:    Patient still significantly acidotic.  IV insulin infusing. Agree with current orders.  May want to add labs for c-peptide and GAD/insulin antibodies to help determine if Type 1 DM?  Continue IV insulin until acidosis cleared (recommend weight based transition once CO2>19 and or Beta hydroxybutyrate<0.5). Patient and family will need significant education regarding DM management prior to D/C.  Will follow and see patient on 07/13/20.   Thanks,  Beryl Meager, RN, BC-ADM Inpatient Diabetes Coordinator Pager [phone] (8a-5p)

## 2020-07-12 NOTE — Progress Notes (Signed)
Peripherally Inserted Central Catheter Placement  The IV Nurse has discussed with the patient and/or persons authorized to consent for the patient, the purpose of this procedure and the potential benefits and risks involved with this procedure.  The benefits include less needle sticks, lab draws from the catheter, and the patient may be discharged home with the catheter. Risks include, but not limited to, infection, bleeding, blood clot (thrombus formation), and puncture of an artery; nerve damage and irregular heartbeat and possibility to perform a PICC exchange if needed/ordered by physician.  Alternatives to this procedure were also discussed.  Bard Power PICC patient education guide, fact sheet on infection prevention and patient information card has been provided to patient /or left at bedside.    PICC Placement Documentation  PICC Triple Lumen 07/12/20 PICC Right Brachial 43 cm 2 cm (Active)  Indication for Insertion or Continuance of Line Vasoactive infusions 07/12/20 1250  Exposed Catheter (cm) 2 cm 07/12/20 1250  Site Assessment Clean;Dry;Intact 07/12/20 1250  Lumen #1 Status Flushed;Saline locked;Blood return noted 07/12/20 1250  Lumen #2 Status Flushed;Saline locked;Blood return noted 07/12/20 1250  Lumen #3 Status Flushed;Saline locked;Blood return noted 07/12/20 1250  Dressing Type Transparent 07/12/20 1250  Dressing Status Clean;Dry;Intact 07/12/20 1250  Antimicrobial disc in place? Yes 07/12/20 1250  Dressing Intervention New dressing 07/12/20 1250  Dressing Change Due 07/19/20 07/12/20 1250       Ethelda Chick 07/12/2020, 12:51 PM

## 2020-07-12 NOTE — TOC Initial Note (Signed)
Transition of Care [facility]) - Initial/Assessment Note    Patient Details  Name: Ian Carrillo MRN: [id number] Date of Birth: 09/20/1995  Transition of [facility]) CM/SW Contact:    Norvel Richards, RN Phone Number: 07/12/2020, 10:22 AM  Clinical Narrative:  [facility] team consulting. Pt has new onset Diabetes. Consulted with Diabetes Coordinator on possible medication needs and assistance upon discharge. He will be seen tomorrow by Diabetes Coordinator. Will continue to monitor for discharge planning.                 Expected Discharge Plan: Home/Self Care Barriers to Discharge: Financial Resources (Pt. may need medication assistance.)   Patient Goals and CMS Choice        Expected Discharge Plan and Services Expected Discharge Plan: Home/Self Care   Discharge Planning Services: CM Consult   Living arrangements for the past 2 months: Single Family Home                                      Prior Living Arrangements/Services Living arrangements for the past 2 months: Single Family Home Lives with:: Self,Parents Patient language and need for interpreter reviewed:: No        Need for Family Participation in Patient Care: Yes (Comment) (Mother) Care giver support system in place?: Yes (comment) (Mother and girlfriend)      Activities of Daily Living Home Assistive Devices/Equipment: None ADL Screening (condition at time of admission) Patient's cognitive ability adequate to safely complete daily activities?: Yes Is the patient deaf or have difficulty hearing?: No Does the patient have difficulty seeing, even when wearing glasses/contacts?: No Does the patient have difficulty concentrating, remembering, or making decisions?: No Patient able to express need for assistance with ADLs?: Yes Does the patient have difficulty dressing or bathing?: No Independently performs ADLs?: Yes (appropriate for developmental age) Does the patient have difficulty walking or climbing  stairs?: No Weakness of Legs: None Weakness of Arms/Hands: None  Permission Sought/Granted                  Emotional Assessment           Psych Involvement: No (comment)  Admission diagnosis:  DKA (diabetic ketoacidosis) (HCC) [E11.10] Chest pain [R07.9] Patient Active Problem List   Diagnosis Date Noted  . DKA (diabetic ketoacidosis) (HCC) 07/11/2020   PCP:  Pcp, No Pharmacy:   [facility] [phone] - [place], [place] - [address] AT [facility] [address] [place] [address] Phone: [phone] Fax: [phone]     Social Determinants of Health (SDOH) Interventions    Readmission Risk Interventions No flowsheet data found.

## 2020-07-12 NOTE — Progress Notes (Signed)
Patient's gap appeared closed, but still acidotic. He wanted to eat. However had nausea and emesis with food. Already given 25 units lantus. I suspect he is not yet ready for PO intake. Would resume insulin D5, continue BMPs. Keep NPO with prn zofran. May need additional lantus in the morning. Acidosis needs to improve.

## 2020-07-13 LAB — BASIC METABOLIC PANEL
Anion gap: 10 (ref 5–15)
Anion gap: 11 (ref 5–15)
Anion gap: 12 (ref 5–15)
Anion gap: 8 (ref 5–15)
Anion gap: 9 (ref 5–15)
Anion gap: 9 (ref 5–15)
BUN: 10 mg/dL (ref 6–20)
BUN: 6 mg/dL (ref 6–20)
BUN: 7 mg/dL (ref 6–20)
BUN: 8 mg/dL (ref 6–20)
BUN: 8 mg/dL (ref 6–20)
BUN: 8 mg/dL (ref 6–20)
CO2: 18 mmol/L — ABNORMAL LOW (ref 22–32)
CO2: 19 mmol/L — ABNORMAL LOW (ref 22–32)
CO2: 19 mmol/L — ABNORMAL LOW (ref 22–32)
CO2: 20 mmol/L — ABNORMAL LOW (ref 22–32)
CO2: 21 mmol/L — ABNORMAL LOW (ref 22–32)
CO2: 21 mmol/L — ABNORMAL LOW (ref 22–32)
Calcium: 8.2 mg/dL — ABNORMAL LOW (ref 8.9–10.3)
Calcium: 8.5 mg/dL — ABNORMAL LOW (ref 8.9–10.3)
Calcium: 8.5 mg/dL — ABNORMAL LOW (ref 8.9–10.3)
Calcium: 8.6 mg/dL — ABNORMAL LOW (ref 8.9–10.3)
Calcium: 8.8 mg/dL — ABNORMAL LOW (ref 8.9–10.3)
Calcium: 8.8 mg/dL — ABNORMAL LOW (ref 8.9–10.3)
Chloride: 102 mmol/L (ref 98–111)
Chloride: 106 mmol/L (ref 98–111)
Chloride: 108 mmol/L (ref 98–111)
Chloride: 108 mmol/L (ref 98–111)
Chloride: 108 mmol/L (ref 98–111)
Chloride: 108 mmol/L (ref 98–111)
Creatinine, Ser: 0.67 mg/dL (ref 0.61–1.24)
Creatinine, Ser: 0.75 mg/dL (ref 0.61–1.24)
Creatinine, Ser: 0.8 mg/dL (ref 0.61–1.24)
Creatinine, Ser: 0.81 mg/dL (ref 0.61–1.24)
Creatinine, Ser: 0.87 mg/dL (ref 0.61–1.24)
Creatinine, Ser: 0.88 mg/dL (ref 0.61–1.24)
GFR, Estimated: >60 mL/min (ref >60)
GFR, Estimated: >60 mL/min (ref >60)
GFR, Estimated: >60 mL/min (ref >60)
GFR, Estimated: >60 mL/min (ref >60)
GFR, Estimated: >60 mL/min (ref >60)
GFR, Estimated: >60 mL/min (ref >60)
Glucose, Bld: 153 mg/dL — ABNORMAL HIGH (ref 70–99)
Glucose, Bld: 162 mg/dL — ABNORMAL HIGH (ref 70–99)
Glucose, Bld: 181 mg/dL — ABNORMAL HIGH (ref 70–99)
Glucose, Bld: 188 mg/dL — ABNORMAL HIGH (ref 70–99)
Glucose, Bld: 283 mg/dL — ABNORMAL HIGH (ref 70–99)
Glucose, Bld: 314 mg/dL — ABNORMAL HIGH (ref 70–99)
Potassium: 2.3 mmol/L — CL (ref 3.5–5.1)
Potassium: 2.3 mmol/L — CL (ref 3.5–5.1)
Potassium: 2.5 mmol/L — CL (ref 3.5–5.1)
Potassium: 2.5 mmol/L — CL (ref 3.5–5.1)
Potassium: 2.8 mmol/L — ABNORMAL LOW (ref 3.5–5.1)
Potassium: 3 mmol/L — ABNORMAL LOW (ref 3.5–5.1)
Sodium: 132 mmol/L — ABNORMAL LOW (ref 135–145)
Sodium: 136 mmol/L (ref 135–145)
Sodium: 137 mmol/L (ref 135–145)
Sodium: 137 mmol/L (ref 135–145)
Sodium: 137 mmol/L (ref 135–145)
Sodium: 138 mmol/L (ref 135–145)

## 2020-07-13 LAB — PHOSPHORUS
Phosphorus: <1 mg/dL — CL (ref 2.5–4.6)
Phosphorus: 2 mg/dL — ABNORMAL LOW (ref 2.5–4.6)
Phosphorus: 1.3 mg/dL — ABNORMAL LOW (ref 2.5–4.6)

## 2020-07-13 LAB — GLUCOSE, CAPILLARY
Glucose-Capillary: 166 mg/dL — ABNORMAL HIGH (ref 70–99)
Glucose-Capillary: 145 mg/dL — ABNORMAL HIGH (ref 70–99)
Glucose-Capillary: 170 mg/dL — ABNORMAL HIGH (ref 70–99)
Glucose-Capillary: 187 mg/dL — ABNORMAL HIGH (ref 70–99)
Glucose-Capillary: 289 mg/dL — ABNORMAL HIGH (ref 70–99)

## 2020-07-13 LAB — MAGNESIUM: Magnesium: 2.3 mg/dL (ref 1.7–2.4)

## 2020-07-13 MED ORDER — ADULT MULTIVITAMIN W/MINERALS CH
1.0000 | ORAL_TABLET | Freq: Every day | ORAL | Status: DC
  Administered 2020-07-13 – 2020-07-15 (×3): 1 via ORAL
  Filled 2020-07-13 (×3): qty 1

## 2020-07-13 MED ORDER — INSULIN GLARGINE 100 UNIT/ML ~~LOC~~ SOLN
10.0000 [IU] | SUBCUTANEOUS | Status: AC
  Administered 2020-07-13: 10 [IU] via SUBCUTANEOUS
  Filled 2020-07-13: qty 0.1

## 2020-07-13 MED ORDER — INSULIN ASPART 100 UNIT/ML ~~LOC~~ SOLN
0.0000 [IU] | Freq: Three times a day (TID) | SUBCUTANEOUS | Status: DC
  Administered 2020-07-13: 2 [IU] via SUBCUTANEOUS
  Administered 2020-07-13: 8 [IU] via SUBCUTANEOUS
  Administered 2020-07-14: 3 [IU] via SUBCUTANEOUS
  Administered 2020-07-14: 8 [IU] via SUBCUTANEOUS
  Administered 2020-07-14: 5 [IU] via SUBCUTANEOUS
  Administered 2020-07-15 (×2): 8 [IU] via SUBCUTANEOUS
  Administered 2020-07-15: 5 [IU] via SUBCUTANEOUS

## 2020-07-13 MED ORDER — LIVING WELL WITH DIABETES BOOK
Freq: Once | Status: AC
  Filled 2020-07-13: qty 1

## 2020-07-13 MED ORDER — GLUCERNA SHAKE PO LIQD
237.0000 mL | Freq: Three times a day (TID) | ORAL | Status: DC
  Administered 2020-07-13 – 2020-07-14 (×3): 237 mL via ORAL

## 2020-07-13 MED ORDER — KCL-LACTATED RINGERS 20 MEQ/L IV SOLN
INTRAVENOUS | Status: DC
  Filled 2020-07-13: qty 1000

## 2020-07-13 MED ORDER — POTASSIUM CHLORIDE CRYS ER 20 MEQ PO TBCR
40.0000 meq | EXTENDED_RELEASE_TABLET | Freq: Once | ORAL | Status: AC
  Administered 2020-07-13: 40 meq via ORAL
  Filled 2020-07-13: qty 2

## 2020-07-13 MED ORDER — POTASSIUM & SODIUM PHOSPHATES 280-160-250 MG PO PACK
1.0000 | PACK | Freq: Three times a day (TID) | ORAL | Status: AC
  Administered 2020-07-13 – 2020-07-14 (×3): 1 via ORAL
  Filled 2020-07-13 (×3): qty 1

## 2020-07-13 MED ORDER — POTASSIUM PHOSPHATES 15 MMOLE/5ML IV SOLN
30.0000 mmol | Freq: Once | INTRAVENOUS | Status: AC
  Administered 2020-07-13: 30 mmol via INTRAVENOUS
  Filled 2020-07-13: qty 10

## 2020-07-13 MED ORDER — POTASSIUM CHLORIDE CRYS ER 20 MEQ PO TBCR: 40.0000 meq | EXTENDED_RELEASE_TABLET | ORAL | Status: DC

## 2020-07-13 MED ORDER — POTASSIUM CHLORIDE 10 MEQ/50ML IV SOLN
10.0000 meq | INTRAVENOUS | Status: AC
Start: 2020-07-13 — End: 2020-07-13
  Administered 2020-07-13 (×4): 10 meq via INTRAVENOUS
  Filled 2020-07-13 (×4): qty 50

## 2020-07-13 MED ORDER — INSULIN ASPART 100 UNIT/ML ~~LOC~~ SOLN
0.0000 [IU] | Freq: Every day | SUBCUTANEOUS | Status: DC
  Administered 2020-07-13 – 2020-07-14 (×2): 3 [IU] via SUBCUTANEOUS

## 2020-07-13 MED ORDER — POTASSIUM CHLORIDE 2 MEQ/ML IV SOLN
INTRAVENOUS | Status: DC
  Filled 2020-07-13 (×4): qty 1000

## 2020-07-13 MED ORDER — INSULIN GLARGINE 100 UNIT/ML ~~LOC~~ SOLN
35.0000 [IU] | Freq: Every day | SUBCUTANEOUS | Status: DC
  Administered 2020-07-13 – 2020-07-14 (×2): 35 [IU] via SUBCUTANEOUS
  Filled 2020-07-13 (×4): qty 0.35

## 2020-07-13 MED ORDER — ENOXAPARIN SODIUM 60 MG/0.6ML ~~LOC~~ SOLN
50.0000 mg | SUBCUTANEOUS | Status: DC
  Administered 2020-07-13 – 2020-07-14 (×2): 50 mg via SUBCUTANEOUS
  Filled 2020-07-13 (×2): qty 0.5
  Filled 2020-07-13: qty 0.6

## 2020-07-13 MED ORDER — INSULIN STARTER KIT- PEN NEEDLES (ENGLISH)
1.0000 | Freq: Once | Status: DC
  Filled 2020-07-13: qty 1

## 2020-07-13 MED ORDER — POTASSIUM CHLORIDE 10 MEQ/50ML IV SOLN
10.0000 meq | INTRAVENOUS | Status: AC
  Administered 2020-07-13 (×4): 10 meq via INTRAVENOUS
  Filled 2020-07-13 (×3): qty 50

## 2020-07-13 MED ORDER — K PHOS MONO-SOD PHOS DI & MONO 155-852-130 MG PO TABS
500.0000 mg | ORAL_TABLET | Freq: Once | ORAL | Status: AC
  Administered 2020-07-13: 500 mg via ORAL
  Filled 2020-07-13: qty 2

## 2020-07-13 NOTE — Progress Notes (Signed)
Initial Nutrition Assessment  DOCUMENTATION CODES:   Not applicable  INTERVENTION:   Monitor magnesium, potassium, and phosphorus daily for at least 3 days, MD to replete as needed, as pt is at risk for refeeding syndrome.    Glucerna Shake po TID, each supplement provides 220 kcal and 10 grams of protein  MVI daily   NUTRITION DIAGNOSIS:   Inadequate oral intake related to poor appetite as evidenced by per patient/family report.  GOAL:   Patient will meet greater than or equal to 90% of their needs  MONITOR:   PO intake,Supplement acceptance,Weight trends,Labs,I & O's  REASON FOR ASSESSMENT:   Consult,Malnutrition Screening Tool    ASSESSMENT:   Patient with PMH significant for COVID in December 2021. Presents this admission with DKA.   Patient endorses decreased PO intake over the last three days given poor appetite. States prior to this he consumed two meals daily that consisted of L-fast food sandwich with grain side and D-pizza or meat, vegetable, grain. Does not consume snack in between. Endorses increased thirst and urination. Appetite this admission slow to progress. RD to provide supplementation to maximize kcal and protein this admission. Patient showing signs of refeeding, potassium and phosphorus being replaced.   Off insulin drip. C-peptide and GAD/insulin pending. Waiting to determine type of DM. Once out of ICU patient will need DM education. RD to place outpatient referral for in dept education.   Patient reports a UBW of 260 lb and a recent wt loss of 10 lb in the last couple of weeks. Records lack weight history making it difficult to quantify actual weight loss.   UOP: 3085 mlx 24 hrs   Drips: potassium phosphate  Medications: SS novolog, lantus Labs: K 2.5 (L) Phosphorus <1.0 (L) CBG 145-187   NUTRITION - FOCUSED PHYSICAL EXAM:  Flowsheet Row Most Recent Value  Orbital Region No depletion  Upper Arm Region No depletion  Thoracic and Lumbar  Region No depletion  Buccal Region No depletion  Temple Region No depletion  Clavicle Bone Region No depletion  Clavicle and Acromion Bone Region No depletion  Scapular Bone Region No depletion  Dorsal Hand No depletion  Patellar Region No depletion  Anterior Thigh Region No depletion  Posterior Calf Region No depletion  Edema (RD Assessment) None  Hair Reviewed  Eyes Reviewed  Mouth Reviewed  Skin Reviewed  Nails Reviewed     Diet Order:   Diet Order            Diet Carb Modified Fluid consistency: Thin; Room service appropriate? Yes  Diet effective now                 EDUCATION NEEDS:   Education needs have been addressed  Skin:  Skin Assessment: Reviewed RN Assessment  Last BM:  PTA  Height:   Ht Readings from Last 1 Encounters:  07/11/20 5\' 7"  (1.702 m)    Weight:   Wt Readings from Last 1 Encounters:  07/12/20 104.2 kg    BMI:  Body mass index is 35.98 kg/m.  Estimated Nutritional Needs:   Kcal:  2400-2600 kcal  Protein:  120-135 grams  Fluid:  >/= 2 L/day  09/11/20 RD, LDN Clinical Nutrition Pager listed in AMION

## 2020-07-13 NOTE — Progress Notes (Signed)
NAME:  Ian Carrillo, MRN:  [id number], DOB:  1995/08/15, LOS: 2 ADMISSION DATE:  07/11/2020,   History of Present Illness:  This is a 25 year old male that presented to the emergency room from home, with a PMX of COVID in DEC 2021, and a Family history of T1DM (maternal grandmother) . He presented with significant dyspnea/shortness of breath and generalized lethargy.  Patient's had significant polydipsia and polyuria for approximately 20 days, with a 10lb weight loss in 10 days, and poor PO intake the 3 days prior to admission. He had also ceased ETOH consumption around 22 days prior to this admission.   He was admitted on 4/2 to 2H and remains in ICU in critical but stable condition.   Pertinent  Medical History  None  Significant Hospital Events: Including procedures, antibiotic start and stop dates in addition to other pertinent events   Admit 4/2, Volume Resuscitation, found to be in DKA, insulin GTT 4/4- Gap closed, insulin gtt stopped, transition to lantus/SSI  Interim History / Subjective:  Nausea yesterday with eating, kept on Insulin GTT overnight  24 hours: Tmax 98.6, HR 97-118, BP 98/46-152/92, RA, +1.4L this admission  Gap closed for two cycles  Subjective: patient would like to know when he could eat, states that he is no longer nauseous, but the hospital food could make him nauseous.  Objective   Blood pressure 132/76, pulse 90, temperature (!) 94.5 F (34.7 C), temperature source Oral, resp. rate 16, height 5\' 7"  (1.702 m), weight 104.2 kg, SpO2 99 %.        Intake/Output Summary (Last 24 hours) at 07/13/2020 1008 Last data filed at 07/13/2020 0900 Gross per 24 hour  Intake 3745.25 ml  Output 3025 ml  Net 720.25 ml   Filed Weights   07/11/20 1902 07/12/20 0430  Weight: 122.5 kg 104.2 kg    Examination: General: Asleep, well nourished, appears comfortable   HEENT: MM pink/moist, anicteric, PERRL 86mm, trachea midline Neuro: GCS 15, RASS 0, MAE CV: s1s2, NSR  on monitor, no m/r/g PULM: Lungs clear in BL upper and lower lobes, air movement thought, chest expansion symmetrical GI: soft, bsx4 active, non tender Extremities: warm/dry, no edema appreciated, cap refill <3 seconds Skin: no rashes or lesions   Labs/imaging that I havepersonally reviewed  (right click and "Reselect all SmartList Selections" daily)  BMP 137NA, 2.3K, Gap 9, GLU 162 Bedside Glucose range  Resolved Hospital Problem list   Life threatening metabolic acidosis DKA Assessment & Plan:  New diagnosed type I diabetic Hypokalemia Hypophosphatemia Neuro exam improved, patient interactive, gap closed for two cycles Plan: -Appreciate Diabetes coordinator assistance, will continue to follow recommendations -10mg  lantus now, stop insulin gtt 1hr post administration -Start carb controlled diet, continue D5LR during transition -ACHS insulin coverage per protocol -Continue Q4H BMP monitoring, will replete and replace aggressively -Continue PICC for aggressive administration of electrolytes -Follow up C-peptide and GAD/insulin -Continue Strict I/O -Continue Cardiac monitoring -Will need diabetes education when out of ICU   Best practice (right click and "Reselect all SmartList Selections" daily)  Diet:  Oral Pain/Anxiety/Delirium protocol (if indicated): No VAP protocol (if indicated): Not indicated DVT prophylaxis: LMWH GI prophylaxis: N/A Glucose control: DKA order set Central venous access:  Yes, and it is still needed Arterial line:  N/A Foley:  N/A Mobility:  OOB  PT consulted: N/A Last date of multidisciplinary goals of care discussion [4/4 with patient at the bedside] Code Status:  full code Disposition: needs ICU    Critical  Care Time: 30 minutes  Flora Lipps Pulmonary and Critical Care Medicine 07/13/2020 10:08 AM  Pager: see AMION  If no response to pager , please call critical care on call (see AMION) until 7pm After 7:00 pm call Elink       Labs    CBC: Recent Labs  Lab 07/11/20 1820 07/11/20 1853 07/11/20 1909 07/11/20 2239  WBC 16.6* 17.0*  --  21.1*  NEUTROABS  --  14.4*  --   --   HGB 17.1* 17.0 18.7* 15.9  HCT 52.3* 51.8 55.0* 47.3  MCV 84.8 84.4  --  84.0  PLT 330 347  --  296    Basic Metabolic Panel: Recent Labs  Lab 07/11/20 2239 07/12/20 0222 07/12/20 0722 07/12/20 0755 07/12/20 1545 07/12/20 2005 07/12/20 2348 07/13/20 0359  NA 133*   < >  --  134* 137 137 136 137  K 3.6   < >  --  2.2* 2.2* 2.0* 2.3* 2.3*  CL 110   < >  --  110 109 107 108 108  CO2 <7*   < >  --  11* 16* 17* 18* 20*  GLUCOSE 380*   < >  --  221* 271* 194* 153* 162*  BUN 21*   < >  --  11 11 12 10 8   CREATININE 1.34*   < >  --  1.08 0.99 1.00 0.87 0.88  CALCIUM 8.2*   < >  --  8.5* 8.3* 8.7* 8.8* 8.8*  MG 2.6*  --  2.3  --   --   --   --   --   PHOS 1.8*  --  <1.0*  --   --   --   --   --    < > = values in this interval not displayed.   GFR: Estimated Creatinine Clearance: 150.1 mL/min (by C-G formula based on SCr of 0.88 mg/dL). Recent Labs  Lab 07/11/20 1820 07/11/20 1853 07/11/20 2239 07/12/20 0403 07/12/20 0755  WBC 16.6* 17.0* 21.1*  --   --   LATICACIDVEN  --   --  3.2* 1.8 1.5    Liver Function Tests: No results for input(s): AST, ALT, ALKPHOS, BILITOT, PROT, ALBUMIN in the last 168 hours. No results for input(s): LIPASE, AMYLASE in the last 168 hours. No results for input(s): AMMONIA in the last 168 hours.  ABG    Component Value Date/Time   HCO3 3.4 (L) 07/11/2020 1909   TCO2 <5 (L) 07/11/2020 1909   ACIDBASEDEF 28.0 (H) 07/11/2020 1909   O2SAT 81.0 07/11/2020 1909     Coagulation Profile: No results for input(s): INR, PROTIME in the last 168 hours.  Cardiac Enzymes: No results for input(s): CKTOTAL, CKMB, CKMBINDEX, TROPONINI in the last 168 hours.  HbA1C: Hgb A1c MFr Bld  Date/Time Value Ref Range Status  07/11/2020 10:39 PM 12.8 (H) 4.8 - 5.6 % Final    Comment:    (NOTE) Pre  diabetes:          5.7%-6.4%  Diabetes:              >6.4%  Glycemic control for   <7.0% adults with diabetes     CBG: Recent Labs  Lab 07/12/20 2339 07/13/20 0134 07/13/20 0349 07/13/20 0608 07/13/20 0728  GLUCAP 153* 166* 145* 170* 187*

## 2020-07-13 NOTE — Progress Notes (Addendum)
Inpatient Diabetes Program Recommendations  AACE/ADA: New Consensus Statement on Inpatient Glycemic Control (2015)  Target Ranges:  Prepandial:   less than 140 mg/dL      Peak postprandial:   less than 180 mg/dL (1-2 hours)      Critically ill patients:  140 - 180 mg/dL  Results for Ian Carrillo, Ian Carrillo (MRN [id number]) as of 07/13/2020 07:37  Ref. Range 07/11/2020 18:53  Beta-Hydroxybutyric Acid Latest Ref Range: 0.05 - 0.27 mmol/L >8.00 (H)  Results for Ian Carrillo, Ian Carrillo (MRN [id number]) as of 07/13/2020 07:37  Ref. Range 07/11/2020 22:39  Hemoglobin A1C Latest Ref Range: 4.8 - 5.6 % 12.8 (H)  (320 mg/dl)  Results for Ian Carrillo, Ian Carrillo (MRN [id number]) as of 07/13/2020 07:37  Ref. Range 07/11/2020 18:53  Glucose Latest Ref Range: 70 - 99 mg/dL 506 (HH)   Results for Ian Carrillo, Ian Carrillo (MRN [id number]) as of 07/13/2020 07:37  Ref. Range 07/12/2020 23:39 07/13/2020 01:34 07/13/2020 03:49 07/13/2020 06:08 07/13/2020 07:28  Glucose-Capillary Latest Ref Range: 70 - 99 mg/dL  25 units LANTUS @6:17pm 153 (H)  IV Insulin Drip 166 (H)  IV Insulin Drip 145 (H)  IV Insulin Drip 170 (H)  IV Insulin Drip 187 (H)    Admit with: DKA. New diagnosis Diabetes Presented with significant dyspnea/shortness of breath and generalized lethargy--Had significant polydipsia and polyuria for approximately 20 days--Over the last 10 days he has lost approximately 10 pounds in his eaten very little over the past 3 days   Current Orders: IV Insulin Drip      Novolog Resistant Correction Scale/ SSI (0-20 units) TID AC + HS      Lantus 25 units QHS    Note Lantus given last PM for transition off the IV Insulin Drip, however, pt had nausea and emesis.  Was left on D5 and IV Insulin drip throughout the night.   MD- Note 4am BMET shows the following: Glucose 162 Anion Gap= 9 CO2 level= 20  If nausea and vomiting has improved, and pt can eat this AM, recommend giving additional Lantus X 1 dose this AM and allowing transition to SQ  Insulin.  Would give an additional 10 units Lantus X 1 dose this AM (so pt will get a total of 35 units on board since last PM)  Increase bedtime Lantus to 35 units QHS for tonight  Please also order C-peptide and GAD/insulin antibodies to help determine if Type 1 DM    Addendum 12:20pm--Met w/ pt at bedside today.  He was sleepy but easily arousable.  Yawned a lot while I was in the room since he has not gotten much sleep since admission.  Spoke with pt about new diagnosis.  Discussed A1C results with him and explained what an A1C is, basic pathophysiology of both Type 1 DM and Type 2 DM, basic home care, importance of checking CBGs and maintaining good CBG control to prevent long-term and short-term complications.  Also reviewed blood sugar goals and A1c goals for home.    Discussed with pt that we are waiting for 2 blood tests to come back (C-Peptide and GAD) that will help us determine if pt's body is leaning more towards Type 1 or Type 2 diabetes.  Explained what each test is and what they will tell us.  Explained to pt that given his presentation with DKA and given his A1c is 12.8% that he will definitely need insulin for home and that we will show him and allow him to practice insulin while he is hospitalized.  I   discussed with pt that I will order educational materials for him and will place an order for OP DM education to be completed after pt goes home.  Discussed with pt the extreme importance for him to have a PCP and also strongly encouraged pt to seek care under an Endocrinologist as an ENDO will be better able to help him with his diabetes needs.  Placed the names of several local ENDO's in pt's AVS.  Also gave pt a basic diabetes educational pamphlet and a pamphlet on HYPO and HYPO treatment.  Re-consulted TOC team to verify pt's Medicaid status as pt told me he has Medicaid coverage (is listed as uninsured in the chart).    RNs to provide ongoing basic DM education at bedside with this  patient.  Have ordered educational booklet, insulin starter kit, CBG meter teaching.  Have also placed RD consult for DM diet education for this patient.     --Will follow patient during hospitalization--  Jeannine Johnston Fishel RN, MSN, CDE Diabetes Coordinator Inpatient Glycemic Control Team Team Pager: [phone] (8a-5p)     

## 2020-07-13 NOTE — Progress Notes (Signed)
AM K+ 2.3 with creat 0.88 and GFR > 60. CCM Electrolyte protocol initiated.

## 2020-07-13 NOTE — Progress Notes (Signed)
Shift Summary: transitioned from IV insulin to subq  N: AAOx4, MAE, afebrile. Independent ambulation with line management  R: RA, clear lungs  CV: ST 90-110, BP WDL, no pressors.   GI: NPO this AM, now tolerating carb managed diet. BG stable 180's off insulin gtt with glargine and SS lispro. Will begin diabetes management this evening, diabetes RN coordinator visited this afternoon. D5LR discontinued, started LR c MIV. BM this shift. Replacing IV and PO electrolytes frequently.   GU: no foley, uses urinal independently with adequate UOP this shift.   Skin: no skin issues.   Fiance visiting bedside, mother updated via phone.   Ruta Capece RN

## 2020-07-13 NOTE — Plan of Care (Signed)

## 2020-07-14 LAB — BASIC METABOLIC PANEL
Anion gap: 10 (ref 5–15)
Anion gap: 11 (ref 5–15)
Anion gap: 7 (ref 5–15)
Anion gap: 9 (ref 5–15)
BUN: 7 mg/dL (ref 6–20)
BUN: 8 mg/dL (ref 6–20)
BUN: 8 mg/dL (ref 6–20)
BUN: 9 mg/dL (ref 6–20)
CO2: 19 mmol/L — ABNORMAL LOW (ref 22–32)
CO2: 20 mmol/L — ABNORMAL LOW (ref 22–32)
CO2: 21 mmol/L — ABNORMAL LOW (ref 22–32)
CO2: 22 mmol/L (ref 22–32)
Calcium: 8.1 mg/dL — ABNORMAL LOW (ref 8.9–10.3)
Calcium: 8.3 mg/dL — ABNORMAL LOW (ref 8.9–10.3)
Calcium: 8.4 mg/dL — ABNORMAL LOW (ref 8.9–10.3)
Calcium: 8.9 mg/dL (ref 8.9–10.3)
Chloride: 103 mmol/L (ref 98–111)
Chloride: 103 mmol/L (ref 98–111)
Chloride: 104 mmol/L (ref 98–111)
Chloride: 105 mmol/L (ref 98–111)
Creatinine, Ser: 0.75 mg/dL (ref 0.61–1.24)
Creatinine, Ser: 0.78 mg/dL (ref 0.61–1.24)
Creatinine, Ser: 0.78 mg/dL (ref 0.61–1.24)
Creatinine, Ser: 0.82 mg/dL (ref 0.61–1.24)
GFR, Estimated: >60 mL/min (ref >60)
GFR, Estimated: >60 mL/min (ref >60)
GFR, Estimated: >60 mL/min (ref >60)
GFR, Estimated: >60 mL/min (ref >60)
Glucose, Bld: 220 mg/dL — ABNORMAL HIGH (ref 70–99)
Glucose, Bld: 247 mg/dL — ABNORMAL HIGH (ref 70–99)
Glucose, Bld: 252 mg/dL — ABNORMAL HIGH (ref 70–99)
Glucose, Bld: 300 mg/dL — ABNORMAL HIGH (ref 70–99)
Potassium: 2.8 mmol/L — ABNORMAL LOW (ref 3.5–5.1)
Potassium: 2.9 mmol/L — ABNORMAL LOW (ref 3.5–5.1)
Potassium: 3 mmol/L — ABNORMAL LOW (ref 3.5–5.1)
Potassium: 3.8 mmol/L (ref 3.5–5.1)
Sodium: 131 mmol/L — ABNORMAL LOW (ref 135–145)
Sodium: 134 mmol/L — ABNORMAL LOW (ref 135–145)
Sodium: 134 mmol/L — ABNORMAL LOW (ref 135–145)
Sodium: 135 mmol/L (ref 135–145)

## 2020-07-14 LAB — GLUCOSE, CAPILLARY
Glucose-Capillary: 188 mg/dL — ABNORMAL HIGH (ref 70–99)
Glucose-Capillary: 233 mg/dL — ABNORMAL HIGH (ref 70–99)
Glucose-Capillary: 139 mg/dL — ABNORMAL HIGH (ref 70–99)
Glucose-Capillary: 158 mg/dL — ABNORMAL HIGH (ref 70–99)
Glucose-Capillary: 174 mg/dL — ABNORMAL HIGH (ref 70–99)
Glucose-Capillary: 181 mg/dL — ABNORMAL HIGH (ref 70–99)
Glucose-Capillary: 235 mg/dL — ABNORMAL HIGH (ref 70–99)
Glucose-Capillary: 289 mg/dL — ABNORMAL HIGH (ref 70–99)
Glucose-Capillary: 257 mg/dL — ABNORMAL HIGH (ref 70–99)
Glucose-Capillary: 279 mg/dL — ABNORMAL HIGH (ref 70–99)

## 2020-07-14 LAB — PHOSPHORUS
Phosphorus: 1.3 mg/dL — ABNORMAL LOW (ref 2.5–4.6)
Phosphorus: 2 mg/dL — ABNORMAL LOW (ref 2.5–4.6)
Phosphorus: 2.4 mg/dL — ABNORMAL LOW (ref 2.5–4.6)

## 2020-07-14 LAB — C-PEPTIDE: C-Peptide: 1.3 ng/mL (ref 1.1–4.4)

## 2020-07-14 LAB — MAGNESIUM: Magnesium: 2.1 mg/dL (ref 1.7–2.4)

## 2020-07-14 MED ORDER — POTASSIUM CHLORIDE 20 MEQ PO PACK: 40.0000 meq | PACK | Freq: Once | ORAL | Status: DC

## 2020-07-14 MED ORDER — POTASSIUM CHLORIDE CRYS ER 20 MEQ PO TBCR
40.0000 meq | EXTENDED_RELEASE_TABLET | Freq: Two times a day (BID) | ORAL | Status: AC
  Administered 2020-07-14 (×2): 40 meq via ORAL
  Filled 2020-07-14 (×2): qty 2

## 2020-07-14 MED ORDER — POTASSIUM CHLORIDE CRYS ER 20 MEQ PO TBCR: 40.0000 meq | EXTENDED_RELEASE_TABLET | Freq: Once | ORAL | Status: DC

## 2020-07-14 MED ORDER — POTASSIUM CHLORIDE 10 MEQ/50ML IV SOLN
10.0000 meq | INTRAVENOUS | Status: AC
  Administered 2020-07-14 (×2): 10 meq via INTRAVENOUS
  Filled 2020-07-14 (×2): qty 50

## 2020-07-14 MED ORDER — POTASSIUM PHOSPHATES 15 MMOLE/5ML IV SOLN
15.0000 mmol | Freq: Once | INTRAVENOUS | Status: AC
  Administered 2020-07-14: 15 mmol via INTRAVENOUS
  Filled 2020-07-14: qty 5

## 2020-07-14 NOTE — Progress Notes (Signed)
Inpatient Diabetes Program Recommendations  AACE/ADA: New Consensus Statement on Inpatient Glycemic Control (2015)  Target Ranges:  Prepandial:   less than 140 mg/dL      Peak postprandial:   less than 180 mg/dL (1-2 hours)      Critically ill patients:  140 - 180 mg/dL   Lab Results  Component Value Date   GLUCAP 233 (H) 07/14/2020   HGBA1C 12.8 (H) 07/11/2020    Review of Glycemic Control Results for Ian Carrillo, [name] (MRN [id number]) as of 07/14/2020 15:45  Ref. Range 07/14/2020 06:48 07/14/2020 12:29  Glucose-Capillary Latest Ref Range: 70 - 99 mg/dL 188 (H) 233 (H)   Diabetes history: New DM  Current orders for Inpatient glycemic control: Lantus 35 units QHS, Novolog 0-15 units TID and 0-5 units QHS  Note:  Spoke with patient and mother at bedside.  He is alert and oriented.  Reviewed diabetes type I and II. Discussed diet modifications and CHO's and impact on glucose.  He has the LWWD booklet at bedside.  Janett Billow, RN has also been educating patient on CBG's, insulin and DM.  She has allowed him to self administer insulin and check CBG's.  Educated patient on insulin pen use at home. Reviewed contents of insulin flexpen starter kit. Reviewed all steps of insulin pen including attachment of needle, 2-unit air shot, dialing up dose, giving injection, removing needle, disposal of sharps, storage of unused insulin, disposal of insulin etc. Patient able to easily provide successful return demonstration.  Discussed hypoglycemia, signs, symptoms and treatments.    Has Medicaid but no PCP.  TOC is following.  Will need follow up in approximately 1 week.  Will need prescription for glucometer.  Would benefit from [facility] as Medicaid covers the CGM.  Will ask MD for order to place prior to DC and will send him home with additional CGM.  Will provide patient with Medicaid authorization form to take to PCP.    Mother has DM type II.  She has used the insulin pen in the past and will be a good  resource for [employer]  Will follow up with patient again tomorrow.    Will continue to follow while inpatient.  Thank you, Reche Dixon, RN, BSN Diabetes Coordinator Inpatient Diabetes Program [phone] (team pager from 8a-5p)

## 2020-07-14 NOTE — Progress Notes (Signed)
eLink Physician-Brief Progress Note Patient Name: Ian Carrillo DOB: 1995-06-01 MRN: [id number]   Date of Service  07/14/2020  HPI/Events of Note  K+ 2.8, PHOS 2.0  eICU Interventions  K+ and PHOS replaced per E-Link electrolyte replacement protocol.        [name] 07/14/2020, 5:56 AM

## 2020-07-14 NOTE — Progress Notes (Signed)
PROGRESS NOTE    Ian Carrillo  RWE:[id number] DOB: September 16, 1995 DOA: 07/11/2020 PCP: Pcp, No  Brief Narrative: 25 year old male with history of Covid in December presented to the ED with lethargy polydipsia polyuria weight loss and shortness of breath. -Diagnosed with new onset diabetes mellitus and DKA, was admitted to the ICU on account of severe metabolic acidosis, pH was 6.9 on admission. -Clinically improving, transitioned off insulin drip yesterday -Continues to have severe hypokalemia, transferred to [facility] service today   Assessment & Plan:   Active Problems: DKA (diabetic ketoacidosis) ([place]) New onset diabetes mellitus -Transitioned off insulin drip yesterday -Continue Lantus 35 units and NovoLog sliding scale -C-peptide and glutamic acid decarboxylase antibody pending -Insulin teaching -Diabetes coordinator following -Dietitian consult -Case management consult for PCP -Transfer out of ICU today  Severe hypokalemia -Due to DKA, insulin administration -Replace IV and p.o. -Labs in a.m.  Hypophosphatemia -Replace  Obesity BMI 35.9 -Recommended weight loss, lifestyle modification  DVT prophylaxis: SCDs Code Status: Full code Family Communication: No family at bedside Disposition Plan:  Status is: Inpatient  Remains inpatient appropriate because:Inpatient level of care appropriate due to severity of illness   Dispo: The patient is from: Home              Anticipated d/c is to: Home              Patient currently is not medically stable to d/c.   Difficult to place patient No   Procedures:   Antimicrobials:    Subjective: -Feels much better overall, learning to administer insulin Objective: Vitals:   07/14/20 0500 07/14/20 0600 07/14/20 0700 07/14/20 0802  BP: 116/65 110/68 127/73   Pulse:      Resp:   13   Temp:    98.3 F (36.8 C)  TempSrc:    Oral  SpO2:      Weight:      Height:        Intake/Output Summary (Last 24 hours) at 07/14/2020  1020 Last data filed at 07/14/2020 0800 Gross per 24 hour  Intake 3115.87 ml  Output 4301 ml  Net -1185.13 ml   Filed Weights   07/11/20 1902 07/12/20 0430  Weight: 122.5 kg 104.2 kg    Examination:  General exam: Pleasant young male sitting up in bed, AAOx3, no distress CVS: S1-S2, regular rate rhythm Lungs: Clear bilaterally Abdomen: Soft, nontender, bowel sounds present Extremities: No edema Skin: No rashes on exposed skin  Psychiatry: Mood & affect appropriate.     Data Reviewed:   CBC: Recent Labs  Lab 07/11/20 1820 07/11/20 1853 07/11/20 1909 07/11/20 2239  WBC 16.6* 17.0*  --  21.1*  NEUTROABS  --  14.4*  --   --   HGB 17.1* 17.0 18.7* 15.9  HCT 52.3* 51.8 55.0* 47.3  MCV 84.8 84.4  --  84.0  PLT 330 347  --  619   Basic Metabolic Panel: Recent Labs  Lab 07/11/20 2239 07/12/20 0222 07/12/20 0722 07/12/20 0755 07/13/20 0825 07/13/20 1030 07/13/20 1618 07/13/20 2105 07/13/20 2357 07/14/20 0352  NA 133*   < >  --    < > 137 138 137 132* 131* 135  K 3.6   < >  --    < > 2.5* 2.5* 2.8* 3.0* 3.0* 2.8*  CL 110   < >  --    < > 108 108 106 102 105 104  CO2 <7*   < >  --    < >  21* 21* 19* 19* 19* 21*  GLUCOSE 380*   < >  --    < > 188* 181* 314* 283* 247* 220*  BUN 21*   < >  --    < > '7 6 8 8 8 7  ' CREATININE 1.34*   < >  --    < > 0.75 0.67 0.81 0.80 0.75 0.78  CALCIUM 8.2*   < >  --    < > 8.5* 8.5* 8.2* 8.6* 8.1* 8.4*  MG 2.6*  --  2.3  --  2.3  --   --   --   --  2.1  PHOS 1.8*  --  <1.0*  --  <1.0*  --  2.0* 1.3* 1.3* 2.0*   < > = values in this interval not displayed.   GFR: Estimated Creatinine Clearance: 165.1 mL/min (by C-G formula based on SCr of 0.78 mg/dL). Liver Function Tests: No results for input(s): AST, ALT, ALKPHOS, BILITOT, PROT, ALBUMIN in the last 168 hours. No results for input(s): LIPASE, AMYLASE in the last 168 hours. No results for input(s): AMMONIA in the last 168 hours. Coagulation Profile: No results for input(s): INR,  PROTIME in the last 168 hours. Cardiac Enzymes: No results for input(s): CKTOTAL, CKMB, CKMBINDEX, TROPONINI in the last 168 hours. BNP (last 3 results) No results for input(s): PROBNP in the last 8760 hours. HbA1C: Recent Labs    07/11/20 2239  HGBA1C 12.8*   CBG: Recent Labs  Lab 07/13/20 0349 07/13/20 0608 07/13/20 0728 07/13/20 1606 07/14/20 0648  GLUCAP 145* 170* 187* 289* 188*   Lipid Profile: No results for input(s): CHOL, HDL, LDLCALC, TRIG, CHOLHDL, LDLDIRECT in the last 72 hours. Thyroid Function Tests: Recent Labs    07/11/20 1820  TSH 0.700   Anemia Panel: No results for input(s): VITAMINB12, FOLATE, FERRITIN, TIBC, IRON, RETICCTPCT in the last 72 hours. Urine analysis:    Component Value Date/Time   COLORURINE STRAW (A) 07/11/2020 2057   APPEARANCEUR CLEAR 07/11/2020 2057   LABSPEC 1.017 07/11/2020 2057   PHURINE 5.0 07/11/2020 2057   GLUCOSEU >=500 (A) 07/11/2020 2057   HGBUR MODERATE (A) 07/11/2020 2057   BILIRUBINUR NEGATIVE 07/11/2020 2057   KETONESUR 80 (A) 07/11/2020 2057   PROTEINUR 100 (A) 07/11/2020 2057   NITRITE NEGATIVE 07/11/2020 2057   LEUKOCYTESUR NEGATIVE 07/11/2020 2057   Sepsis Labs: '@LABRCNTIP' (procalcitonin:4,lacticidven:4)  ) Recent Results (from the past 240 hour(s))  Resp Panel by RT-PCR (Flu A&B, Covid) Nasopharyngeal Swab     Status: None   Collection Time: 07/11/20  7:13 PM   Specimen: Nasopharyngeal Swab; Nasopharyngeal(NP) swabs in vial transport medium  Result Value Ref Range Status   SARS Coronavirus 2 by RT PCR NEGATIVE NEGATIVE Final    Comment: (NOTE) SARS-CoV-2 target nucleic acids are NOT DETECTED.  The SARS-CoV-2 RNA is generally detectable in upper respiratory specimens during the acute phase of infection. The lowest concentration of SARS-CoV-2 viral copies this assay can detect is 138 copies/mL. A negative result does not preclude SARS-Cov-2 infection and should not be used as the sole basis for treatment  or other patient management decisions. A negative result may occur with  improper specimen collection/handling, submission of specimen other than nasopharyngeal swab, presence of viral mutation(s) within the areas targeted by this assay, and inadequate number of viral copies(<138 copies/mL). A negative result must be combined with clinical observations, patient history, and epidemiological information. The expected result is Negative.  Fact Sheet for Patients:  EntrepreneurPulse.com.au  Fact  Sheet for Healthcare Providers:  IncredibleEmployment.be  This test is no t yet approved or cleared by the [place] FDA and  has been authorized for detection and/or diagnosis of SARS-CoV-2 by FDA under an Emergency Use Authorization (EUA). This EUA will remain  in effect (meaning this test can be used) for the duration of the COVID-19 declaration under Section 564(b)(1) of the Act, 21 U.S.C.section 360bbb-3(b)(1), unless the authorization is terminated  or revoked sooner.       Influenza A by PCR NEGATIVE NEGATIVE Final   Influenza B by PCR NEGATIVE NEGATIVE Final    Comment: (NOTE) The Xpert Xpress SARS-CoV-2/FLU/RSV plus assay is intended as an aid in the diagnosis of influenza from Nasopharyngeal swab specimens and should not be used as a sole basis for treatment. Nasal washings and aspirates are unacceptable for Xpert Xpress SARS-CoV-2/FLU/RSV testing.  Fact Sheet for Patients: EntrepreneurPulse.com.au  Fact Sheet for Healthcare Providers: IncredibleEmployment.be  This test is not yet approved or cleared by the [place] FDA and has been authorized for detection and/or diagnosis of SARS-CoV-2 by FDA under an Emergency Use Authorization (EUA). This EUA will remain in effect (meaning this test can be used) for the duration of the COVID-19 declaration under Section 564(b)(1) of the Act, 21 U.S.C. section  360bbb-3(b)(1), unless the authorization is terminated or revoked.  Performed at [facility], [place] [address], [place], [place] [address]   MRSA PCR Screening     Status: None   Collection Time: 07/11/20 10:27 PM   Specimen: Nasopharyngeal  Result Value Ref Range Status   MRSA by PCR NEGATIVE NEGATIVE Final    Comment:        The GeneXpert MRSA Assay (FDA approved for NASAL specimens only), is one component of a comprehensive MRSA colonization surveillance program. It is not intended to diagnose MRSA infection nor to guide or monitor treatment for MRSA infections. Performed at [facility], [place] [address] [place], [place] [address]      Radiology Studies: [place] EKG SITE RITE  Result Date: 07/12/2020 If Site Rite image not attached, placement could not be confirmed due to current cardiac rhythm.  Scheduled Meds: . Chlorhexidine Gluconate Cloth  6 each Topical Daily  . enoxaparin (LOVENOX) injection  50 mg Subcutaneous Q24H  . feeding supplement (GLUCERNA SHAKE)  237 mL Oral TID BM  . insulin aspart  0-15 Units Subcutaneous TID WC  . insulin aspart  0-5 Units Subcutaneous QHS  . insulin glargine  35 Units Subcutaneous QHS  . insulin starter kit- pen needles  1 kit Other Once  . multivitamin with minerals  1 tablet Oral Daily  . potassium & sodium phosphates  1 packet Oral Q8H  . potassium chloride  40 mEq Oral BID  . sodium chloride flush  10-40 mL Intracatheter Q12H   Continuous Infusions: . lactated ringers with kcl 125 mL/hr at 07/14/20 0600  . potassium PHOSPHATE IVPB (in mmol) 15 mmol (07/14/20 0628)     LOS: 3 days    Time spent: 74mn  PDomenic Polite MD [facility]  07/14/2020, 10:20 AM

## 2020-07-15 ENCOUNTER — Other Ambulatory Visit (HOSPITAL_COMMUNITY): Payer: Self-pay

## 2020-07-15 LAB — BASIC METABOLIC PANEL
Anion gap: 11 (ref 5–15)
BUN: 7 mg/dL (ref 6–20)
CO2: 19 mmol/L — ABNORMAL LOW (ref 22–32)
Calcium: 8.9 mg/dL (ref 8.9–10.3)
Chloride: 104 mmol/L (ref 98–111)
Creatinine, Ser: 0.81 mg/dL (ref 0.61–1.24)
GFR, Estimated: >60 mL/min (ref >60)
Glucose, Bld: 249 mg/dL — ABNORMAL HIGH (ref 70–99)
Potassium: 3.2 mmol/L — ABNORMAL LOW (ref 3.5–5.1)
Sodium: 134 mmol/L — ABNORMAL LOW (ref 135–145)

## 2020-07-15 LAB — GLUCOSE, CAPILLARY
Glucose-Capillary: 243 mg/dL — ABNORMAL HIGH (ref 70–99)
Glucose-Capillary: 262 mg/dL — ABNORMAL HIGH (ref 70–99)
Glucose-Capillary: 296 mg/dL — ABNORMAL HIGH (ref 70–99)

## 2020-07-15 LAB — MAGNESIUM: Magnesium: 2.1 mg/dL (ref 1.7–2.4)

## 2020-07-15 LAB — GLUTAMIC ACID DECARBOXYLASE AUTO ABS: Glutamic Acid Decarb Ab: <5 U/mL (ref 0.0–5.0)

## 2020-07-15 MED ORDER — NOVOLOG FLEXPEN 100 UNIT/ML ~~LOC~~ SOPN
6.0000 [IU] | PEN_INJECTOR | Freq: Three times a day (TID) | SUBCUTANEOUS | 0 refills | Status: DC
  Filled 2020-07-15: qty 6, 30d supply, fill #0

## 2020-07-15 MED ORDER — INSULIN PEN NEEDLE 32G X 4 MM MISC
0 refills | Status: DC
  Filled 2020-07-15: qty 100, 30d supply, fill #0

## 2020-07-15 MED ORDER — BLOOD GLUCOSE MONITOR KIT
PACK | 0 refills | Status: AC
  Filled 2020-07-15: qty 1, fill #0

## 2020-07-15 MED ORDER — TRUE METRIX BLOOD GLUCOSE TEST VI STRP
ORAL_STRIP | 0 refills | Status: AC
  Filled 2020-07-15: qty 200, 30d supply, fill #0

## 2020-07-15 MED ORDER — TRUEPLUS LANCETS 28G MISC
0 refills | Status: AC
  Filled 2020-07-15: qty 100, 30d supply, fill #0

## 2020-07-15 MED ORDER — POTASSIUM CHLORIDE CRYS ER 20 MEQ PO TBCR
40.0000 meq | EXTENDED_RELEASE_TABLET | Freq: Once | ORAL | Status: AC
  Administered 2020-07-15: 40 meq via ORAL
  Filled 2020-07-15: qty 2

## 2020-07-15 MED ORDER — TRUE METRIX METER W/DEVICE KIT
PACK | 0 refills | Status: AC
  Filled 2020-07-15: qty 1, 1d supply, fill #0

## 2020-07-15 MED ORDER — LEVEMIR FLEXTOUCH 100 UNIT/ML ~~LOC~~ SOPN
40.0000 [IU] | PEN_INJECTOR | Freq: Every day | SUBCUTANEOUS | 0 refills | Status: DC
  Filled 2020-07-15: qty 12, 30d supply, fill #0

## 2020-07-15 NOTE — Plan of Care (Signed)
Discharge instruction to the patient and he stated understanding. Insulin administration explained and patient stated understanding. VS checked and within normal limit. IV and tele-monitor discontinued. Questions and concerns answered. Patient waiting for his ride at this time.  Problem: Education: Goal: Ability to describe self-care measures that may prevent or decrease complications (Diabetes Survival Skills Education) will improve 07/15/2020 1720 by Concepcion Living, RN Outcome: Adequate for Discharge 07/15/2020 1121 by Concepcion Living, RN Outcome: Progressing 07/15/2020 1120 by Concepcion Living, RN Outcome: Progressing Goal: Individualized Educational Video(s) 07/15/2020 1720 by Concepcion Living, RN Outcome: Adequate for Discharge 07/15/2020 1121 by Concepcion Living, RN Outcome: Progressing 07/15/2020 1120 by Concepcion Living, RN Outcome: Progressing   Problem: Coping: Goal: Ability to adjust to condition or change in health will improve 07/15/2020 1720 by Concepcion Living, RN Outcome: Adequate for Discharge 07/15/2020 1121 by Concepcion Living, RN Outcome: Progressing 07/15/2020 1120 by Concepcion Living, RN Outcome: Progressing   Problem: Fluid Volume: Goal: Ability to maintain a balanced intake and output will improve 07/15/2020 1720 by Concepcion Living, RN Outcome: Adequate for Discharge 07/15/2020 1121 by Concepcion Living, RN Outcome: Progressing 07/15/2020 1120 by Concepcion Living, RN Outcome: Progressing   Problem: Health Behavior/Discharge Planning: Goal: Ability to identify and utilize available resources and services will improve 07/15/2020 1720 by Concepcion Living, RN Outcome: Adequate for Discharge 07/15/2020 1121 by Concepcion Living, RN Outcome: Progressing 07/15/2020 1120 by Concepcion Living, RN Outcome: Progressing Goal: Ability to manage health-related needs will improve 07/15/2020 1720 by Concepcion Living, RN Outcome: Adequate for Discharge 07/15/2020 1121 by Concepcion Living,  RN Outcome: Progressing 07/15/2020 1120 by Concepcion Living, RN Outcome: Progressing   Problem: Metabolic: Goal: Ability to maintain appropriate glucose levels will improve 07/15/2020 1720 by Concepcion Living, RN Outcome: Adequate for Discharge 07/15/2020 1121 by Concepcion Living, RN Outcome: Progressing 07/15/2020 1120 by Concepcion Living, RN Outcome: Progressing   Problem: Nutritional: Goal: Maintenance of adequate nutrition will improve 07/15/2020 1720 by Concepcion Living, RN Outcome: Adequate for Discharge 07/15/2020 1121 by Concepcion Living, RN Outcome: Progressing 07/15/2020 1120 by Concepcion Living, RN Outcome: Progressing Goal: Progress toward achieving an optimal weight will improve 07/15/2020 1720 by Concepcion Living, RN Outcome: Adequate for Discharge 07/15/2020 1121 by Concepcion Living, RN Outcome: Progressing 07/15/2020 1120 by Concepcion Living, RN Outcome: Progressing   Problem: Skin Integrity: Goal: Risk for impaired skin integrity will decrease 07/15/2020 1720 by Concepcion Living, RN Outcome: Adequate for Discharge 07/15/2020 1121 by Concepcion Living, RN Outcome: Progressing 07/15/2020 1120 by Concepcion Living, RN Outcome: Progressing   Problem: Tissue Perfusion: Goal: Adequacy of tissue perfusion will improve 07/15/2020 1720 by Concepcion Living, RN Outcome: Adequate for Discharge 07/15/2020 1121 by Concepcion Living, RN Outcome: Progressing 07/15/2020 1120 by Concepcion Living, RN Outcome: Progressing   Problem: Education: Goal: Ability to describe self-care measures that may prevent or decrease complications (Diabetes Survival Skills Education) will improve 07/15/2020 1720 by Concepcion Living, RN Outcome: Adequate for Discharge 07/15/2020 1121 by Concepcion Living, RN Outcome: Progressing 07/15/2020 1120 by Concepcion Living, RN Outcome: Progressing Goal: Individualized Educational Video(s) 07/15/2020 1720 by Concepcion Living, RN Outcome: Adequate for Discharge 07/15/2020 1121 by  Concepcion Living, RN Outcome: Progressing 07/15/2020 1120 by Concepcion Living, RN Outcome: Progressing   Problem: Coping: Goal: Ability to adjust to condition or change in health will improve 07/15/2020 1720 by Concepcion Living, RN Outcome: Adequate for Discharge 07/15/2020 1121 by Concepcion Living, RN Outcome: Progressing 07/15/2020 1120 by Concepcion Living, RN Outcome: Progressing   Problem: Fluid Volume: Goal: Ability to maintain a balanced intake and  output will improve 07/15/2020 1720 by Concepcion Living, RN Outcome: Adequate for Discharge 07/15/2020 1121 by Concepcion Living, RN Outcome: Progressing 07/15/2020 1120 by Concepcion Living, RN Outcome: Progressing   Problem: Health Behavior/Discharge Planning: Goal: Ability to identify and utilize available resources and services will improve 07/15/2020 1720 by Concepcion Living, RN Outcome: Adequate for Discharge 07/15/2020 1121 by Concepcion Living, RN Outcome: Progressing 07/15/2020 1120 by Concepcion Living, RN Outcome: Progressing Goal: Ability to manage health-related needs will improve 07/15/2020 1720 by Concepcion Living, RN Outcome: Adequate for Discharge 07/15/2020 1121 by Concepcion Living, RN Outcome: Progressing 07/15/2020 1120 by Concepcion Living, RN Outcome: Progressing   Problem: Metabolic: Goal: Ability to maintain appropriate glucose levels will improve 07/15/2020 1720 by Concepcion Living, RN Outcome: Adequate for Discharge 07/15/2020 1121 by Concepcion Living, RN Outcome: Progressing 07/15/2020 1120 by Concepcion Living, RN Outcome: Progressing   Problem: Nutritional: Goal: Maintenance of adequate nutrition will improve 07/15/2020 1720 by Concepcion Living, RN Outcome: Adequate for Discharge 07/15/2020 1121 by Concepcion Living, RN Outcome: Progressing 07/15/2020 1120 by Concepcion Living, RN Outcome: Progressing Goal: Progress toward achieving an optimal weight will improve 07/15/2020 1720 by Concepcion Living, RN Outcome: Adequate for  Discharge 07/15/2020 1121 by Concepcion Living, RN Outcome: Progressing 07/15/2020 1120 by Concepcion Living, RN Outcome: Progressing   Problem: Skin Integrity: Goal: Risk for impaired skin integrity will decrease 07/15/2020 1720 by Concepcion Living, RN Outcome: Adequate for Discharge 07/15/2020 1121 by Concepcion Living, RN Outcome: Progressing 07/15/2020 1120 by Concepcion Living, RN Outcome: Progressing   Problem: Tissue Perfusion: Goal: Adequacy of tissue perfusion will improve 07/15/2020 1720 by Concepcion Living, RN Outcome: Adequate for Discharge 07/15/2020 1121 by Concepcion Living, RN Outcome: Progressing 07/15/2020 1120 by Concepcion Living, RN Outcome: Progressing

## 2020-07-15 NOTE — Plan of Care (Signed)
  RD consulted for nutrition education regarding diabetes.   Lab Results  Component Value Date   HGBA1C 12.8 (H) 07/11/2020    RD provided "Carbohydrate Counting for People with Diabetes" handout from the Academy of Nutrition and Dietetics. Discussed different food groups and their effects on blood sugar, emphasizing carbohydrate-containing foods. Provided list of carbohydrates and recommended serving sizes of common foods.  Discussed importance of controlled and consistent carbohydrate intake throughout the day. Provided examples of ways to balance meals/snacks and encouraged intake of high-fiber, whole grain complex carbohydrates. Teach back method used.  Expect great compliance.  Body mass index is 37.79 kg/m. Pt meets criteria for obese based on current BMI.  Current diet order is carb modified, patient is consuming approximately 75-100% of meals at this time. Labs and medications reviewed. Pt already being followed by RD; note full assessment on 4/4. Refer to note for further details. RD will continue to follow pt.

## 2020-07-15 NOTE — Progress Notes (Signed)
Late Entry-Pt arrived from H2 at approx 9pm on 07/14/20. Alert and responsive, vs stable. Transferred from bed to bed without difficulty. Pt is ambulatory, however, continues to have generalized weakness. Oriented to room. Verbalized understanding.

## 2020-07-15 NOTE — Progress Notes (Addendum)
Inpatient Diabetes Program Recommendations  AACE/ADA: New Consensus Statement on Inpatient Glycemic Control (2015)  Target Ranges:  Prepandial:   less than 140 mg/dL      Peak postprandial:   less than 180 mg/dL (1-2 hours)      Critically ill patients:  140 - 180 mg/dL   Lab Results  Component Value Date   GLUCAP 243 (H) 07/15/2020   HGBA1C 12.8 (H) 07/11/2020    Review of Glycemic Control Results for Ian Carrillo, Ian Carrillo (MRN [id number]) as of 07/15/2020 07:50  Ref. Range 07/14/2020 12:29 07/14/2020 17:04 07/14/2020 21:12 07/15/2020 07:24  Glucose-Capillary Latest Ref Range: 70 - 99 mg/dL 606 (H) 301 (H) 601 (H) 243 (H)    Inpatient Diabetes Program Recommendations:    Increase Lantus to 40 units QHS.    Will follow up with patient again today.  Addendum @ 1231:  Spoke with patient again today after verifying with case manager, Samson Frederic that he only has Medicaid family planning which does not cover prescriptions.  Discussed a program that is offered through [email or web address] which allows patients to receive Levemir and Novolog monthly for $99.  He is employed and states he can afford that.  Samson Frederic has made him an appointment with [facility] and they may be able to help him with cost as well.  He started the application process for Levemir/Novolog while I was in the room.  Provided him with a Relion meter.  Demonstrated how to use.  He should take his meter with him to his PCP appointment for review.  Attached Relion products to AVS.  He can buy more strips at [facility] for $10.  He can also purchase insulin pen needles at [employer] once he runs out.  Might consider prescribing Metformin 500 mg daily.  Discussed Metformin with patient, how it works, side effects and to take with a meal.    Reviewed hypoglycemia, signs, symptoms and treatments. Reviewed importance of exercise and The Plate Method.  He is aware to call [facility] for CBG's consistently > 200 mg/dL and < 093 mg/dL.    MD-For DC please consider:  Levemir 40  units QHS Novolog 6 units with meals TID  Please send to [facility] pharmacy for delivery to bedside.   Levemir Flextouch insulin pen # [id number] Novolog Flextouch insulin pen # [id number] Insulin pen needles # [id number]  Will continue to follow while inpatient.  Thank you, Dulce Sellar, RN, BSN Diabetes Coordinator Inpatient Diabetes Program [phone] (team pager from 8a-5p)        Will continue to follow while inpatient.  Thank you, Dulce Sellar, RN, BSN Diabetes Coordinator Inpatient Diabetes Program [phone] (team pager from 8a-5p)

## 2020-07-15 NOTE — TOC Transition Note (Signed)
Transition of Care [facility]) - CM/SW Discharge Note   Patient Details  Name: Ian Carrillo MRN: [id number] Date of Birth: 1995-10-21  Transition of Care [facility]) CM/SW Contact:  Beckie Busing, RN Phone Number: [phone]  07/15/2020, 2:15 PM   Clinical Narrative:    Follow up completed, MATCH completed for 30 day supply of meds.  Patient has been updated and aware of plan. No other needs noted at this time. TOC will sign off.    Final next level of care: Home/Self Care Barriers to Discharge: No Barriers Identified   Patient Goals and CMS Choice Patient states their goals for this hospitalization and ongoing recovery are:: Ready to go home CMS Medicare.gov Compare Post Acute Care list provided to:: Patient Choice offered to / list presented to : NA  Discharge Placement                       Discharge Plan and Services   Discharge Planning Services: CM Consult            DME Arranged: N/A DME Agency: NA       HH Arranged: NA HH Agency: NA        Social Determinants of Health (SDOH) Interventions     Readmission Risk Interventions Readmission Risk Prevention Plan 07/15/2020  Post Dischage Appt Complete  Medication Screening Complete  Transportation Screening Complete

## 2020-07-15 NOTE — Discharge Summary (Signed)
Physician Discharge Summary  Ian Carrillo JXB:[id number] DOB: 04/29/1995  PCP: Pcp, No  Admitted from: Home Discharged to: Home  Admit date: 07/11/2020 Discharge date: 07/15/2020  Recommendations for Outpatient Follow-up:    Follow-up Information    [place] Follow up.   Why: Your appointment has been schedules for April 26 @ 9:30 am. Please bring your medications with you. If you are unable to keep this appointment, please call to reschedule. It is important that you keep this appointment!  To be seen with rpt labs(CBC/BMP). Contact information: [place] [id number] [place]: None    Equipment/Devices: None    Discharge Condition: Improved and stable.   Code Status: Full Code Diet recommendation:  Discharge Diet Orders (From admission, onward)    Start     Ordered   07/15/20 0000  Diet - low sodium heart healthy        07/15/20 1441   07/15/20 0000  Diet Carb Modified        07/15/20 1441           Discharge Diagnoses:  Active Problems:   DKA (diabetic ketoacidosis) ([place])   Brief Summary: 25 year old male with no pertinent medical history except history of Covid infection in December 2021, presented with complaints of shortness of breath, lethargic, polyuria, weight loss.  He was admitted by the critical care service to the ICU for new onset DM in DKA with severe electrolyte abnormalities including hypokalemia.  After stabilization, care was transferred over to the hospitalist service on 07/14/2020.  HPI per Dr. Rhys Martini as copied below:  "This is a 25 year old male that presented to the emergency room from home.  He presented with significant dyspnea/shortness of breath and generalized lethargy.  Patient's had significant polydipsia and polyuria for approximately 20 days.  Over the last 10 days he has lost approximately 10 pounds in his eaten very  little over the past 3 days.  22 days ago the patient stopped drinking all alcohol and had been drinking a significant amount on a daily basis.  Fifth of alcohol would last approximately 2 to 3 days.  Patient denies any fever sweats or chills.  No cough, sore throat, rhinorrhea, nausea, vomiting, diarrhea.  Positive for generalized lethargy and fatigue.  The patient has a family history of his maternal grandmother is type I diabetic.  Patient did have Covid in December 2021."  Assessment and plan:  DKA in newly diagnosed diabetes mellitus, likely type I but could be type II.  Admitted to ICU by CCM team.  Admission labs: Sodium 125, potassium 4.2, chloride 98, bicarbonate <7, glucose 488, BUN 21, creatinine 1.52, hemoglobin 17.1, WBC 16.6.  VBG: pH 6.914.  Treated per DKA protocol with aggressive IV fluids, insulin drip per Endo tool, frequent and close monitoring of BMP.  After DKA resolved, patient was transitioned to Levemir and mealtime NovoLog.  Diabetes coordinator consulted and assisted with management including extensive patient counseling regarding all aspects of DM care and insulin management.  They have also arranged for outpatient diabetes education.  Dietitian consulted today for assistance.  Glutamic acid Decarb Ab: <5, C-peptide: 1.3  A1c 12.8, TSH: 0.7.  As discussed with DM coordinator, increased Levemir from 35 units to 40 units at bedtime.  Also discharging on NovoLog 6 units 3 times daily with meals.  Metformin was considered but given that he  is just coming out of DKA, recommend initiating this during close outpatient follow-up.  Also consider initiating ARB/ACEI for proteinuria and monitoring of same.  As discussed with TOC team, patient received his discharge meds through the [place] via a match letter.  Patient only had family-planning Medicaid which would not cover his prescription medications.  Severe hypokalemia  Closely followed and replaced  multiple times.  Replaced today prior to discharge as well.  Follow BMP closely as outpatient.  Hypophosphatemia  Replaced and improved.  AKI  Secondary to dehydration and DKA  Resolved  Leukocytosis  Likely stress margination from DKA.  No clinical concern for infectious etiology.  Follow CBC as outpatient.  Microscopic hematuria  Unclear etiology.  No UTI symptoms.  Recommend repeating urine microscopy in a couple of weeks and if persists then consider further evaluation which may include urology consultation.  Body mass index is 37.79 kg/m.  Obesity  Counseled regarding importance of diet, exercise and weight loss.  He verbalized understanding.    Consultations:  PCCM  Procedures:  PICC line-discontinued prior to discharge   Discharge Instructions  Discharge Instructions    Ambulatory referral to Nutrition and Diabetic Education   Complete by: As directed    Admit DKA/ New diagnosis Diabetes--Possibly Type 1.  GAD and C-peptide pending.  A1c= 12.8%.  Per pt, he has Medicaid coverage.  Please wait until after discharge to call for appt.  Thanks!   Call MD for:  difficulty breathing, headache or visual disturbances   Complete by: As directed    Call MD for:  extreme fatigue   Complete by: As directed    Call MD for:  persistant dizziness or light-headedness   Complete by: As directed    Call MD for:  persistant nausea and vomiting   Complete by: As directed    Call MD for:  severe uncontrolled pain   Complete by: As directed    Call MD for:  temperature >100.4   Complete by: As directed    Diet - low sodium heart healthy   Complete by: As directed    Diet Carb Modified   Complete by: As directed    Increase activity slowly   Complete by: As directed        Medication List    TAKE these medications   BD Pen Needle Nano U/F 32G X 4 MM Misc Generic drug: Insulin Pen Needle Use as per directions, 3 times daily with meals and at bedtime.   blood  glucose meter kit and supplies Kit Dispense based on patient and insurance preference. Use up to four times daily as directed.   Levemir FlexTouch 100 UNIT/ML FlexPen Generic drug: insulin detemir Inject 40 Units into the skin at bedtime.   NovoLOG FlexPen 100 UNIT/ML FlexPen Generic drug: insulin aspart Inject 6 Units into the skin 3 (three) times daily with meals.   True Metrix Blood Glucose Test test strip Generic drug: glucose blood use as directed   True Metrix Meter w/Device Kit use as directed   TRUEplus Lancets 28G Misc use as directed      No Known Allergies    Procedures/Studies: DG Chest 2 View  Result Date: 07/11/2020 CLINICAL DATA:  Chest pain.  Shortness of breath. EXAM: CHEST - 2 VIEW COMPARISON:  None. FINDINGS: The heart size and mediastinal contours are within normal limits. Both lungs are clear. No visible pleural effusions or pneumothorax. No acute osseous abnormality. IMPRESSION: No active cardiopulmonary disease. Electronically Signed  By: Margaretha Sheffield MD   On: 07/11/2020 18:29   [place] EKG SITE RITE  Result Date: 07/12/2020 If Site Rite image not attached, placement could not be confirmed due to current cardiac rhythm.     Subjective: Patient seen this morning.  Denied complaints.  Anxious to be discharged home.  States that he is motivated to take good care of himself.  Reported that he was slightly shocked by the diabetes diagnosis.  Indicated that his mother was also diagnosed as diabetic at a young age, was on medications but was able to come off medications eventually with diet exercise etc.  She used to be on insulins.  Grandmother on diabetic meds.  Discharge Exam:  Vitals:   07/15/20 0432 07/15/20 0730 07/15/20 1523 07/15/20 1650  BP: (!) 137/97  140/80 137/77  Pulse: 87  95 96  Resp: _0 Temp: 99.1 F (37.3 C)  98.6 F (37 C) 98.7 F (37.1 C)  TempSrc: Oral  Oral Oral  SpO2: 97% 98% 99% 99%  Weight:      Height:         General: Pleasant young male, moderately built and obese sitting up comfortably in bed without distress.  Oral mucosa moist. Cardiovascular: S1 & S2 heard, RRR, S1/S2 +. No murmurs, rubs, gallops or clicks. No JVD or pedal edema.  Telemetry personally reviewed: Sinus rhythm.  Occasional mild sinus tachycardia. Respiratory: Clear to auscultation without wheezing, rhonchi or crackles. No increased work of breathing. Abdominal:  Non distended, non tender & soft. No organomegaly or masses appreciated. Normal bowel sounds heard. CNS: Alert and oriented. No focal deficits. Extremities: no edema, no cyanosis    The results of significant diagnostics from this hospitalization (including imaging, microbiology, ancillary and laboratory) are listed below for reference.     Microbiology: Recent Results (from the past 240 hour(s))  Resp Panel by RT-PCR (Flu A&B, Covid) Nasopharyngeal Swab     Status: None   Collection Time: 07/11/20  7:13 PM   Specimen: Nasopharyngeal Swab; Nasopharyngeal(NP) swabs in vial transport medium  Result Value Ref Range Status   SARS Coronavirus 2 by RT PCR NEGATIVE NEGATIVE Final    Comment: (NOTE) SARS-CoV-2 target nucleic acids are NOT DETECTED.  The SARS-CoV-2 RNA is generally detectable in upper respiratory specimens during the acute phase of infection. The lowest concentration of SARS-CoV-2 viral copies this assay can detect is 138 copies/mL. A negative result does not preclude SARS-Cov-2 infection and should not be used as the sole basis for treatment or other patient management decisions. A negative result may occur with  improper specimen collection/handling, submission of specimen other than nasopharyngeal swab, presence of viral mutation(s) within the areas targeted by this assay, and inadequate number of viral copies(<138 copies/mL). A negative result must be combined with clinical observations, patient history, and epidemiological information. The  expected result is Negative.  Fact Sheet for Patients:  EntrepreneurPulse.com.au  Fact Sheet for Healthcare Providers:  IncredibleEmployment.be  This test is no t yet approved or cleared by the [place] FDA and  has been authorized for detection and/or diagnosis of SARS-CoV-2 by FDA under an Emergency Use Authorization (EUA). This EUA will remain  in effect (meaning this test can be used) for the duration of the COVID-19 declaration under Section 564(b)(1) of the Act, 21 U.S.C.section 360bbb-3(b)(1), unless the authorization is terminated  or revoked sooner.       Influenza A by PCR NEGATIVE NEGATIVE Final   Influenza B by  PCR NEGATIVE NEGATIVE Final    Comment: (NOTE) The Xpert Xpress SARS-CoV-2/FLU/RSV plus assay is intended as an aid in the diagnosis of influenza from Nasopharyngeal swab specimens and should not be used as a sole basis for treatment. Nasal washings and aspirates are unacceptable for Xpert Xpress SARS-CoV-2/FLU/RSV testing.  Fact Sheet for Patients: EntrepreneurPulse.com.au  Fact Sheet for Healthcare Providers: IncredibleEmployment.be  This test is not yet approved or cleared by the [place] FDA and has been authorized for detection and/or diagnosis of SARS-CoV-2 by FDA under an Emergency Use Authorization (EUA). This EUA will remain in effect (meaning this test can be used) for the duration of the COVID-19 declaration under Section 564(b)(1) of the Act, 21 U.S.C. section 360bbb-3(b)(1), unless the authorization is terminated or revoked.  Performed at [facility], [place] [address], [place], [place] [address]   MRSA PCR Screening     Status: None   Collection Time: 07/11/20 10:27 PM   Specimen: Nasopharyngeal  Result Value Ref Range Status   MRSA by PCR NEGATIVE NEGATIVE Final    Comment:        The GeneXpert MRSA Assay (FDA approved for NASAL specimens only), is  one component of a comprehensive MRSA colonization surveillance program. It is not intended to diagnose MRSA infection nor to guide or monitor treatment for MRSA infections. Performed at [facility], [place] [address], [place], [place] [address]      Labs: CBC: Recent Labs  Lab 07/11/20 1820 07/11/20 1853 07/11/20 1909 07/11/20 2239  WBC 16.6* 17.0*  --  21.1*  NEUTROABS  --  14.4*  --   --   HGB 17.1* 17.0 18.7* 15.9  HCT 52.3* 51.8 55.0* 47.3  MCV 84.8 84.4  --  84.0  PLT 330 347  --  217    Basic Metabolic Panel: Recent Labs  Lab 07/11/20 2239 07/12/20 0222 07/12/20 0722 07/12/20 0755 07/13/20 0825 07/13/20 1030 07/13/20 1618 07/13/20 2105 07/13/20 2357 07/14/20 0352 07/14/20 0927 07/14/20 1539 07/15/20 0314  NA 133*   < >  --    < > 137   < > 137 132* 131* 135 134* 134* 134*  K 3.6   < >  --    < > 2.5*   < > 2.8* 3.0* 3.0* 2.8* 2.9* 3.8 3.2*  CL 110   < >  --    < > 108   < > 106 102 105 104 103 103 104  CO2 <7*   < >  --    < > 21*   < > 19* 19* 19* 21* 22 20* 19*  GLUCOSE 380*   < >  --    < > 188*   < > 314* 283* 247* 220* 252* 300* 249*  BUN 21*   < >  --    < > 7   < > _0 CREATININE 1.34*   < >  --    < > 0.75   < > 0.81 0.80 0.75 0.78 0.78 0.82 0.81  CALCIUM 8.2*   < >  --    < > 8.5*   < > 8.2* 8.6* 8.1* 8.4* 8.3* 8.9 8.9  MG 2.6*  --  2.3  --  2.3  --   --   --   --  2.1  --   --  2.1  PHOS 1.8*  --  <1.0*  --  <1.0*  --  2.0* 1.3* 1.3* 2.0* 2.4*  --   --    < > = values in this interval not displayed.    Liver Function Tests: No results for input(s): AST, ALT, ALKPHOS, BILITOT, PROT, ALBUMIN in the last 168 hours.  CBG: Recent Labs  Lab 07/14/20 1704 07/14/20 2112 07/15/20 0724 07/15/20 1136 07/15/20 1604  GLUCAP 257* 279* 243* 262* 296*     Urinalysis    Component Value Date/Time   COLORURINE STRAW (A) 07/11/2020 2057   APPEARANCEUR CLEAR 07/11/2020 2057   LABSPEC 1.017 07/11/2020 2057   PHURINE 5.0 07/11/2020  2057   GLUCOSEU >=500 (A) 07/11/2020 2057   HGBUR MODERATE (A) 07/11/2020 2057   BILIRUBINUR NEGATIVE 07/11/2020 2057   KETONESUR 80 (A) 07/11/2020 2057   PROTEINUR 100 (A) 07/11/2020 2057   NITRITE NEGATIVE 07/11/2020 2057   LEUKOCYTESUR NEGATIVE 07/11/2020 2057      Time coordinating discharge: 45 minutes  SIGNED:  Vernell Leep, MD, FACP, [facility]. [facility]  To contact the attending provider between 7A-7P or the covering provider during after hours 7P-7A, please log into the web site [email or web address] and access using universal [place] password for that web site. If you do not have the password, please call the hospital operator.

## 2020-07-15 NOTE — TOC Progression Note (Addendum)
Transition of Care [facility]) - Progression Note    Patient Details  Name: Ian Carrillo MRN: [id number] Date of Birth: [birth year]  [facility]) CM/SW Contact  Beckie Busing, RN Phone Number: [phone]  07/15/2020, 1:32 PM  Clinical Narrative:    [facility] consulted for patient with no medical insurance/ PCP.  MATCH form has been completed to provide patient with 30 day supply of meds.  1400 Hospital follow up visit has been scheduled for 08/04/20 @ 9:30. MD has been made aware and is ok with this appointment. No other needs noted at this time. TOC will sign off.    Expected Discharge Plan: Home/Self Care Barriers to Discharge: Continued Medical Work up  Expected Discharge Plan and Services Expected Discharge Plan: Home/Self Care   Discharge Planning Services: CM Consult   Living arrangements for the past 2 months: Single Family Home                                       Social Determinants of Health (SDOH) Interventions    Readmission Risk Interventions Readmission Risk Prevention Plan 07/15/2020  Post Dischage Appt Complete  Medication Screening Complete  Transportation Screening Complete

## 2020-07-15 NOTE — Discharge Instructions (Signed)
Local Endocrinologists [facility] [phone]) 1. Dr. Mliss Fritz Endocrinology [phone]) 1. Dr. Carlus Pavlov 2. Dr. Reather Littler 3. Abby [facility] [phone]) 1. Dr. Talmage Coin [facility] [phone]) 1. Dr. Dorisann Frames 2. Dr. Georgiann Mccoy Endocrinology [phone]) [[place] office]  [phone]) [[place] office] 1. Dr. Efraim Kaufmann Solum 2. Dr. Verdis Frederickson [facility]) [phone]) 1. Autumn Hudnall Yetta Barre), [place] 2. Dr. Izell [place] 3. Dr. Jillyn Ledger.  Diabetic Ketoacidosis Diabetic ketoacidosis (DKA) is a serious complication of diabetes. This condition develops when there is not enough insulin in the body. Insulin is a hormone that regulates blood sugar (glucose) levels in the body. Normally, insulin allows glucose to enter the cells in the body. The cells break down glucose for energy. Without enough insulin, the body cannot break down glucose and breaks down fats instead. This leads to high blood glucose levels in the body. It also leads to the production of acids that are called ketones. Ketones are poisonous at high levels. If diabetic ketoacidosis is not treated, it can cause severe dehydration and can lead to a coma or death. What are the causes? This condition develops when a lack of insulin causes the body to break down fats instead of glucose. This may be triggered by:  Stress on the body. This stress can be brought on by an illness.  Infection.  Medicines that raise blood glucose levels.  Not taking or skipping doses of diabetes medicines.  New onset of type 1 diabetes mellitus.  Missing insulin on purpose or by accident.  Interruption of insulin through an insulin pump. This can happen if the cannula that connects you to the insulin pump gets dislodged or kinked. What are the signs or symptoms? Symptoms of this  condition include:  Excessive thirst or dry mouth.  Excessive urination.  Abdominal pain.  Nausea or vomiting.  Vision changes.  Fruity or sweet-smelling breath.  Weight loss.  Irritability or confusion.  Rapid breathing.  High blood glucose.  High levels of ketones in the body. To know your ketone levels: ? Collect urine in a small cup. ? Dip a test strip in the urine. ? Wait for it to change color. ? Compare the test strip results to the chart on the container. How is this diagnosed? This condition is diagnosed based on your medical history, a physical exam, and blood tests. You may also have a urine test to check for ketones. How is this treated? This condition may be treated with:  Fluid replacement. This may be done with IV fluids to correct dehydration.  Correcting high blood glucose with insulin. This may be given through the skin as injections or through an IV.  Electrolyte replacement. Electrolytes are minerals in your blood. Electrolytes such as potassium and sodium may be given in pill form or through an IV.  Antibiotic medicines. These may be prescribed if your condition was caused by an infection. Diabetic ketoacidosis is a serious medical condition. You may need emergency treatment in the hospital so that you can be monitored closely. Follow these instructions at home: Medicines  Take over-the-counter and prescription medicines only as told by your health care provider.  Continue to take insulin and other diabetes medicines as told by your health care provider.  If you were prescribed an antibiotic medicine, take it as told by your health care provider. Do not stop taking the antibiotic even if you start to feel better. Eating and drinking  Drink enough fluids to keep your urine pale yellow.  If you are able to eat, follow your usual diet and drink sugar-free liquids such as water, tea, and sugar-free soft drinks. You can also have sugar-free gelatin  or ice pops.  If you are not able to eat, drink liquids that contain sugar in small amounts as you are able. Liquids include fruit juice, regular soft drinks, and sherbet.   Checking ketones and blood glucose  Check your urine for ketones when you are ill and as told by your health care provider. ? If your blood glucose is 240 mg/dL (78.2 mmol/L) or higher, check your urine ketones every 4 hours. If you have moderate or large ketones, call your health care provider.  Check your blood glucose every day, and as often as told by your health care provider. ? If your blood glucose is high, drink plenty of fluids. This helps to flush out ketones. ? If your blood glucose is above your target for 2 tests in a row, contact your health care provider.   General instructions  Carry a medical alert card or wear medical alert jewelry that shows that you have diabetes.  Exercise only as told by your health care provider. Do not exercise when your blood glucose is high and you have ketones in your urine.  If you get sick, call your health care provider and begin treatment quickly. Your body often needs extra insulin to fight an illness. Check your blood glucose every 4 hours when you are sick.  Keep all follow-up visits as told by your health care provider. This is important. Where to find more information  American Diabetes Association: diabetes.org Contact a health care provider if:  Your blood glucose level is higher than 240 mg/dL (95.6 mmol/L) for 2 days in a row.  You have moderate or large ketones in your urine.  You have a fever.  You cannot eat or drink without vomiting.  You have been vomiting for more than 2 hours.  You continue to have symptoms of diabetic ketoacidosis.  You develop new symptoms. Get help right away if:  Your blood glucose monitor reads high even when you are taking insulin.  You faint.  You have chest pain.  You have trouble breathing.  You have sudden  trouble speaking or swallowing.  You have vomiting or diarrhea that gets worse after 3 hours.  You are unable to stay awake.  You have trouble thinking.  You are severely dehydrated. Symptoms of severe dehydration include: ? Extreme thirst. ? Dry mouth. ? Rapid breathing. These symptoms may represent a serious problem that is an emergency. Do not wait to see if the symptoms will go away. Get medical help right away. Call your local emergency services (911 in the [place] Do not drive yourself to the hospital. Summary  Diabetic ketoacidosis is a serious complication of diabetes. This condition develops when there is not enough insulin in the body.  This condition is diagnosed based on your medical history, a physical exam, and blood tests. You may also have a urine test to check for ketones.  Diabetic ketoacidosis is a serious medical condition. You may need emergency treatment in the hospital to monitor your condition.  Contact your health care provider if your blood glucose is higher than 240 mg/dL for 2 days in a row or if you have moderate or large ketones in your urine. This information is not intended to replace advice given to you by your health  care provider. Make sure you discuss any questions you have with your health care provider. Document Revised: 02/20/2019 Document Reviewed: 02/20/2019 Elsevier Patient Education  2021 Elsevier Inc.      Additional Discharge Instructions  Please get your medications reviewed and adjusted by your Primary MD.  Please request your Primary MD to go over all Hospital Tests and Procedure/Radiological results at the follow up, please get all Hospital records sent to your Prim MD by signing hospital release before you go home.  If you had Pneumonia of Lung problems at the Hospital: Please get a 2 view Chest X ray done in 6-8 weeks after hospital discharge or sooner if instructed by your Primary MD.  If you have Congestive Heart  Failure: Please call your Cardiologist or Primary MD anytime you have any of the following symptoms:  1) 3 pound weight gain in 24 hours or 5 pounds in 1 week  2) shortness of breath, with or without a dry hacking cough  3) swelling in the hands, feet or stomach  4) if you have to sleep on extra pillows at night in order to breathe  Follow cardiac low salt diet and 1.5 lit/day fluid restriction.  If you have diabetes Accuchecks 4 times/day, Once in AM empty stomach and then before each meal. Log in all results and show them to your primary doctor at your next visit. If any glucose reading is under 80 or above 300 call your primary MD immediately.  If you have Seizure/Convulsions/Epilepsy: Please do not drive, operate heavy machinery, participate in activities at heights or participate in high speed sports until you have seen by Primary MD or a Neurologist and advised to do so again.  If you had Gastrointestinal Bleeding: Please ask your Primary MD to check a complete blood count within one week of discharge or at your next visit. Your endoscopic/colonoscopic biopsies that are pending at the time of discharge, will also need to followed by your Primary MD.  Get Medicines reviewed and adjusted. Please take all your medications with you for your next visit with your Primary MD  Please request your Primary MD to go over all hospital tests and procedure/radiological results at the follow up, please ask your Primary MD to get all Hospital records sent to his/her office.  If you experience worsening of your admission symptoms, develop shortness of breath, life threatening emergency, suicidal or homicidal thoughts you must seek medical attention immediately by calling 911 or calling your MD immediately  if symptoms less severe.  You must read complete instructions/literature along with all the possible adverse reactions/side effects for all the Medicines you take and that have been prescribed to  you. Take any new Medicines after you have completely understood and accpet all the possible adverse reactions/side effects.   Do not drive or operate heavy machinery when taking Pain medications.   Do not take more than prescribed Pain, Sleep and Anxiety Medications  Special Instructions: If you have smoked or chewed Tobacco  in the last 2 yrs please stop smoking, stop any regular Alcohol  and or any Recreational drug use.  Wear Seat belts while driving.  Please note You were cared for by a hospitalist during your hospital stay. If you have any questions about your discharge medications or the care you received while you were in the hospital after you are discharged, you can call the unit and asked to speak with the hospitalist on call if the hospitalist that took care of you is  not available. Once you are discharged, your primary care physician will handle any further medical issues. Please note that NO REFILLS for any discharge medications will be authorized once you are discharged, as it is imperative that you return to your primary care physician (or establish a relationship with a primary care physician if you do not have one) for your aftercare needs so that they can reassess your need for medications and monitor your lab values.  You can reach the hospitalist office at phone [phone] or fax [phone]   If you do not have a primary care physician, you can call [phone] for a physician referral.

## 2020-07-15 NOTE — Progress Notes (Signed)
Continue with Diabetic education. Pt educated on how to perform self cbg checks explained procedure with meter and allowed pt to set up meter and perform cbg check. Pt was able to talk writer through the procedure and also pt did explain how to perform insulin injections. Education will continue until discharge.

## 2020-07-30 ENCOUNTER — Other Ambulatory Visit: Payer: Self-pay

## 2020-07-30 ENCOUNTER — Encounter: Payer: Self-pay | Admitting: Dietician

## 2020-07-30 ENCOUNTER — Encounter: Payer: Medicaid Other | Attending: Pulmonary Disease | Admitting: Dietician

## 2020-07-30 DIAGNOSIS — E119 Type 2 diabetes mellitus without complications: Secondary | ICD-10-CM | POA: Insufficient documentation

## 2020-07-30 NOTE — Progress Notes (Addendum)
Diabetes Self-Management Education  Visit Type: First/Initial  Appt. Start Time: 0910 Appt. End Time: 1045  07/30/2020  [name], identified by name and date of birth, is a 25 y.o. male with a diagnosis of Diabetes: Type 2.   ASSESSMENT Patient is here today alone.  He has been newly diagnosed with diabetes. He states that he is adjusting well and taking meds and checking his blood sugar consistently. He is avoiding soda and juice. He is trying to not eat past 9 pm. Never has liked a lot of sweets. He was drinking 1/4 of a bottle of alcohol daily and quit prior to diagnosis.  History includes: DKA, Diabetes (Type 1 vs type 2  07/2020), Covid (03/2021). He states that he stopped drinking alcohol 06/2020.) A1C:  12.8% 07/13/2020, GAD <5 and C-peptide 1.3 07/13/20.   Medications include:  Levemir 40 units q HS, Novolog 6 units before each meal Fasting blood glucose 124 today and less than 130 overall. He is using the [employer]ion Blood glucose meter. He has a card from [employer] to be used for insulin costs.  Called patient this afternoon and discussed that patient qualifies for the FreeStyle LIbre 2 as he takes multiple injections of insulin daily.  There is generally no charge for Medicaid.  He will use his phone as a reader.This will require prior authorization from his MD.  He is to call for training if needed when he receives this.  Patient lives with his girlfriend.  They share shopping and cooking.  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  His hours flip from week to week - 9-8 vs 9-6.  He walks a lot with his job. 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  He has a lot of supportive family and coworkers.  Height 5\' 7"  (1.702 m), weight 233 lb (105.7 kg). Body mass index is 36.49 kg/m.   Diabetes Self-Management Education - 07/30/20 0946      Visit Information   Visit Type First/Initial      Initial Visit   Diabetes Type Type 2    Are you currently following a meal plan? Yes     What type of meal plan do you follow? lower carbohydrate    Are you taking your medications as prescribed? Yes    Date Diagnosed 07/2020      Health Coping   How would you rate your overall health? Excellent      Psychosocial Assessment   Patient Belief/Attitude about Diabetes Motivated to manage diabetes    Self-care barriers None    Self-management support Doctor's office;Family;Friends    Other persons present Patient    Patient Concerns Nutrition/Meal planning    Special Needs None    Preferred Learning Style No preference indicated    Learning Readiness Ready    How often do you need to have someone help you when you read instructions, pamphlets, or other written materials from your doctor or pharmacy? 1 - Never    What is the last grade level you completed in school? 12      Pre-Education Assessment   Patient understands the diabetes disease and treatment process. Needs Instruction    Patient understands incorporating nutritional management into lifestyle. Needs Instruction    Patient undertands incorporating physical activity into lifestyle. Needs Instruction    Patient understands using medications safely. Needs Instruction    Patient understands monitoring blood glucose, interpreting and using results Needs Instruction    Patient understands prevention, detection, and  treatment of acute complications. Needs Instruction    Patient understands prevention, detection, and treatment of chronic complications. Needs Instruction    Patient understands how to develop strategies to address psychosocial issues. Needs Instruction    Patient understands how to develop strategies to promote health/change behavior. Needs Instruction      Complications   Last HgB A1C per patient/outside source 12.8 %   07/11/2020   How often do you check your blood sugar? > 4 times/day    Fasting Blood glucose range (mg/dL) 99-833    Number of hypoglycemic episodes per month 0    Number of hyperglycemic  episodes per week 0    Have you had a dilated eye exam in the past 12 months? No    Have you had a dental exam in the past 12 months? No    Are you checking your feet? Yes    How many days per week are you checking your feet? 7      Dietary Intake   Breakfast eggs, bacon/sausage, fruit, celery   8:15   Snack (morning) none    Lunch leftovers   2   Snack (afternoon) occasional sugar free jello or protein bar    Dinner taco's with low carb tortillas OR   8:15   Snack (evening) none    Beverage(s) water, crystal lite, diet soda      Exercise   Exercise Type Light (walking / raking leaves)   weights and walking   How many days per week to you exercise? 7    How many minutes per day do you exercise? 30    Total minutes per week of exercise 210      Patient Education   Previous Diabetes Education Yes (please comment)   briefly when diagnosed at the hospital   Disease state  Definition of diabetes, type 1 and 2, and the diagnosis of diabetes    Nutrition management  Food label reading, portion sizes and measuring food.;Role of diet in the treatment of diabetes and the relationship between the three main macronutrients and blood glucose level;Reviewed blood glucose goals for pre and post meals and how to evaluate the patients' food intake on their blood glucose level.    Physical activity and exercise  Role of exercise on diabetes management, blood pressure control and cardiac health.    Medications Taught/reviewed insulin injection, site rotation, insulin storage and needle disposal.;Reviewed patients medication for diabetes, action, purpose, timing of dose and side effects.    Monitoring Taught/discussed recording of test results and interpretation of SMBG.;Identified appropriate SMBG and/or A1C goals.;Yearly dilated eye exam;Daily foot exams    Acute complications Taught treatment of hypoglycemia - the 15 rule.;Discussed and identified patients' treatment of hyperglycemia.    Chronic  complications Relationship between chronic complications and blood glucose control;Retinopathy and reason for yearly dilated eye exams;Dental care    Psychosocial adjustment Role of stress on diabetes;Identified and addressed patients feelings and concerns about diabetes      Individualized Goals (developed by patient)   Nutrition General guidelines for healthy choices and portions discussed    Physical Activity Exercise 5-7 days per week;30 minutes per day    Medications take my medication as prescribed    Monitoring  test my blood glucose as discussed    Problem Solving obtaining insulin    Reducing Risk increase portions of healthy fats;examine blood glucose patterns    Health Coping discuss diabetes with (comment)   MD, RD, CDCES  Post-Education Assessment   Patient understands the diabetes disease and treatment process. Demonstrates understanding / competency    Patient understands incorporating nutritional management into lifestyle. Needs Review    Patient undertands incorporating physical activity into lifestyle. Demonstrates understanding / competency    Patient understands using medications safely. Demonstrates understanding / competency    Patient understands monitoring blood glucose, interpreting and using results Demonstrates understanding / competency    Patient understands prevention, detection, and treatment of acute complications. Demonstrates understanding / competency    Patient understands prevention, detection, and treatment of chronic complications. Demonstrates understanding / competency    Patient understands how to develop strategies to address psychosocial issues. Demonstrates understanding / competency    Patient understands how to develop strategies to promote health/change behavior. Demonstrates understanding / competency      Outcomes   Expected Outcomes Demonstrated interest in learning. Expect positive outcomes    Future DMSE 2 months    Program Status Not  Completed           Individualized Plan for Diabetes Self-Management Training:   Learning Objective:  Patient will have a greater understanding of diabetes self-management. Patient education plan is to attend individual and/or group sessions per assessed needs and concerns.   Plan:   Patient Instructions  Randie Heinz job on the changes that you made! Be sure to rotate your insulin injection sites.  Please call for any questions!      Expected Outcomes:  Demonstrated interest in learning. Expect positive outcomes  Education material provided: ADA - How to Thrive: A Guide for Your Journey with Diabetes, Food label handouts, Meal plan card, Snack sheet and Diabetes Resources; dining out with diabetes; FreeStyle LIbre brochure; Dexcom brochure  If problems or questions, patient to contact team via:  Phone  Future DSME appointment: 2 months

## 2020-07-30 NOTE — Patient Instructions (Signed)
Great job on the changes that you made! Be sure to rotate your insulin injection sites.  Please call for any questions!

## 2020-08-04 ENCOUNTER — Other Ambulatory Visit: Payer: Self-pay

## 2020-08-04 ENCOUNTER — Ambulatory Visit (INDEPENDENT_AMBULATORY_CARE_PROVIDER_SITE_OTHER): Payer: Self-pay | Admitting: Primary Care

## 2020-08-04 ENCOUNTER — Ambulatory Visit (INDEPENDENT_AMBULATORY_CARE_PROVIDER_SITE_OTHER): Payer: Self-pay | Admitting: *Deleted

## 2020-08-04 ENCOUNTER — Encounter (INDEPENDENT_AMBULATORY_CARE_PROVIDER_SITE_OTHER): Payer: Self-pay | Admitting: Primary Care

## 2020-08-04 VITALS — BP 138/97 | HR 105 | Temp 97.5°F | Ht 67.0 in | Wt 232.4 lb

## 2020-08-04 DIAGNOSIS — Z09 Encounter for follow-up examination after completed treatment for conditions other than malignant neoplasm: Secondary | ICD-10-CM

## 2020-08-04 DIAGNOSIS — E119 Type 2 diabetes mellitus without complications: Secondary | ICD-10-CM

## 2020-08-04 DIAGNOSIS — Z7689 Persons encountering health services in other specified circumstances: Secondary | ICD-10-CM

## 2020-08-04 DIAGNOSIS — I10 Essential (primary) hypertension: Secondary | ICD-10-CM

## 2020-08-04 DIAGNOSIS — Z23 Encounter for immunization: Secondary | ICD-10-CM

## 2020-08-04 DIAGNOSIS — Z760 Encounter for issue of repeat prescription: Secondary | ICD-10-CM

## 2020-08-04 LAB — GLUCOSE, POCT (MANUAL RESULT ENTRY): POC Glucose: 97 mg/dl (ref 70–99)

## 2020-08-04 MED ORDER — LEVEMIR FLEXTOUCH 100 UNIT/ML ~~LOC~~ SOPN
40.0000 [IU] | PEN_INJECTOR | Freq: Every day | SUBCUTANEOUS | 6 refills | Status: DC
  Filled 2020-08-04: qty 15, 37d supply, fill #0

## 2020-08-04 MED ORDER — INSULIN PEN NEEDLE 32G X 4 MM MISC
3 refills | Status: AC
  Filled 2020-08-04: qty 200, fill #0

## 2020-08-04 MED ORDER — METFORMIN HCL 1000 MG PO TABS
1000.0000 mg | ORAL_TABLET | Freq: Two times a day (BID) | ORAL | 3 refills | Status: DC
  Filled 2020-08-04: qty 60, 30d supply, fill #0

## 2020-08-04 MED ORDER — NOVOLOG FLEXPEN 100 UNIT/ML ~~LOC~~ SOPN: PEN_INJECTOR | SUBCUTANEOUS | 6 refills | Status: DC

## 2020-08-04 MED ORDER — LISINOPRIL 20 MG PO TABS
20.0000 mg | ORAL_TABLET | Freq: Every day | ORAL | 3 refills | Status: DC
  Filled 2020-08-04: qty 30, 30d supply, fill #0

## 2020-08-04 NOTE — Progress Notes (Signed)
New Patient Office Visit  Subjective:  Patient ID: Ian Carrillo, male    DOB: 06-May-1995  Age: 26 y.o. MRN: [id number]  CC:  Chief Complaint  Patient presents with  . Hospitalization Follow-up    DKA    HPI [name] is a 25 year old obese male who presents for establishment of care hospital follow-up discharge.  Patient presented to the emergency room on July 10, 2020 with symptoms of significant dyspnea/shortness of breath and generalized lethargy.  Endorsed having  significant polydipsia and polyuria for approximately 20 days.   Lost approximately 10 pounds over the past 3 days no appetite. Admit date to the hospital was 07/11/20, patient was discharged from the hospital on 07/15/20, patient was admitted for: DKA.Today he denies polydipsia, polyphagia ,polyuria and vision changes. Patient states he feels great since taking insulin, monitoring diet and exercising. 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  Past Medical History:  Diagnosis Date  . Diabetes mellitus without complication ([place])    No past surgical history on file.  No family history on file.  Social History   Socioeconomic History  . Marital status: Single    Spouse name: Not on file  . Number of children: Not on file  . Years of education: Not on file  . Highest education level: Not on file  Occupational History  . Not on file  Tobacco Use  . Smoking status: Never Smoker  . Smokeless tobacco: Never Used  Substance and Sexual Activity  . Alcohol use: Yes    Comment: quit 22 days ago  . Drug use: Not Currently  . Sexual activity: Not on file  Other Topics Concern  . Not on file  Social History Narrative  . Not on file   Social Determinants of Health   Financial Resource Strain: Not on file  Food Insecurity: Not on file  Transportation Needs: Not on file  Physical Activity: Not on file  Stress: Not on file  Social Connections: Not on file  Intimate Partner Violence: Not on  file    ROS Review of Systems  Constitutional:       Feels Great since discharge  All other systems reviewed and are negative.   Objective:   Today's Vitals: BP (!) 138/97 (BP Location: Right Arm, Patient Position: Sitting, Cuff Size: Large)   Pulse (!) 105   Temp (!) 97.5 F (36.4 C) (Temporal)   Ht '5\' 7"'  (1.702 m)   Wt 232 lb 6.4 oz (105.4 kg)   SpO2 96%   BMI 36.40 kg/m   Physical Exam Vitals reviewed.  Constitutional:      Appearance: He is obese.  HENT:     Head: Normocephalic.     Right Ear: Tympanic membrane and external ear normal.     Left Ear: Tympanic membrane and external ear normal.     Nose: Nose normal.  Eyes:     Extraocular Movements: Extraocular movements intact.     Pupils: Pupils are equal, round, and reactive to light.  Cardiovascular:     Rate and Rhythm: Normal rate and regular rhythm.  Pulmonary:     Effort: Pulmonary effort is normal.     Breath sounds: Normal breath sounds.  Abdominal:     General: Bowel sounds are normal. There is distension.     Palpations: Abdomen is soft.  Musculoskeletal:        General: Normal range of motion.     Cervical back: Normal  range of motion and neck supple.  Skin:    General: Skin is warm and dry.  Neurological:     Mental Status: He is alert and oriented to person, place, and time.  Psychiatric:        Mood and Affect: Mood normal.        Behavior: Behavior normal.        Thought Content: Thought content normal.        Judgment: Judgment normal.     Assessment & Plan:  Ian Carrillo was seen today for hospitalization follow-up.  Diagnoses and all orders for this visit:  Type 2 diabetes mellitus without complication, with long-term current use of insulin ([place]) Patient was very confused if he was type I or type II diabetic.  He was told type I and type II during hospitalization and nutrition consult. Explained he is a type II diabetic because C-peptide and GAD abs were normal, therefore making type 1  less likely.  Patient is very pleased to know he is a type II diabetic and does not have to be on insulin for the rest of his life.  Changed his NovoLog insulin to sliding scale eat before taking any insulin continue Levemir 40 units and added metformin 1000 mg twice daily.  Instructed to take 1/2 tablet twice a day for 1 week then the following week start taking the whole pill twice a day.  Also discussed side effects of metformin primarily GI problems and symptoms-       Microalbumin/Creatinine Ratio, Urine -     Glucose (CBG) -     Lipid Panel -     metFORMIN (GLUCOPHAGE) 1000 MG tablet; Take 1 tablet (1,000 mg total) by mouth 2 (two) times daily with a meal.  Need for Tdap vaccination -     Tdap vaccine greater than or equal to 7yo IM  Hospital discharge follow-up Patient presented to the emergency room for signs and symptoms of diabetes increased urination, increased thirst, weight loss and fatigue.  He was admitted for DKA and new diagnosis of diabetes.  Hospital follow-up indicated establish care with PCP which was completed and order blood work at visit also completed -     CBC with Differential -     Basic Metabolic Panel  Essential hypertension Blood pressure is elevated at greater than 130/80 generally diagnosis of hypertension has not made on first visit.  Per ADA guidelines and new diagnosis of type 2 diabetes with a A1c of 12.8 it is recommended that he be started on a ACE or ARB.  Explained to patient prescribing lisinopril 20 mg daily in the morning can increase urination.  Also, side effects can be unexplained cough, angioedema-swelling around face lips throat and tongue 911.  Question if blood pressure was elevated due to pork.  We discussed a low-sodium diet  Encounter to establish care Establish care with new provider  Other orders/medication refills -     insulin aspart (NOVOLOG FLEXPEN) 100 UNIT/ML FlexPen; For blood sugars 0-150 give 0 units of insulin,  201-250 give 6  units, 251-300 give 6 units, 301-350 give 10  units, 351-400 give 12  units,> 400 give 12 units and call M.D. Discussed hypoglycemia protocol. -     insulin detemir (LEVEMIR FLEXTOUCH) 100 UNIT/ML FlexPen; Inject 40 Units into the skin at bedtime. -     Insulin Pen Needle 32G X 4 MM MISC; Use as per directions, 3 times daily with meals and at bedtime. -  lisinopril (ZESTRIL) 20 MG tablet; Take 1 tablet (20 mg total) by mouth daily.     Outpatient Encounter Medications as of 08/04/2020  Medication Sig  . blood glucose meter kit and supplies KIT Dispense based on patient and insurance preference. Use up to four times daily as directed.  . Blood Glucose Monitoring Suppl (TRUE METRIX METER) w/Device KIT use as directed (Patient taking differently: use as directed)  . cetirizine (ZYRTEC) 10 MG tablet Take 10 mg by mouth daily.  Marland Kitchen glucose blood (TRUE METRIX BLOOD GLUCOSE TEST) test strip use as directed (Patient taking differently: use as directed)  . lisinopril (ZESTRIL) 20 MG tablet Take 1 tablet (20 mg total) by mouth daily.  . metFORMIN (GLUCOPHAGE) 1000 MG tablet Take 1 tablet (1,000 mg total) by mouth 2 (two) times daily with a meal.  . TRUEplus Lancets 28G MISC use as directed (Patient taking differently: use as directed)  . [DISCONTINUED] Insulin Pen Needle 32G X 4 MM MISC Use as per directions, 3 times daily with meals and at bedtime.  . insulin aspart (NOVOLOG FLEXPEN) 100 UNIT/ML FlexPen For blood sugars 0-150 give 0 units of insulin,  201-250 give 6 units, 251-300 give 6 units, 301-350 give 10  units, 351-400 give 12  units,> 400 give 12 units and call M.D. Discussed hypoglycemia protocol.  . insulin detemir (LEVEMIR FLEXTOUCH) 100 UNIT/ML FlexPen Inject 40 Units into the skin at bedtime.  . Insulin Pen Needle 32G X 4 MM MISC Use as per directions, 3 times daily with meals and at bedtime.  . [DISCONTINUED] insulin aspart (NOVOLOG FLEXPEN) 100 UNIT/ML FlexPen Inject 6 Units into the  skin 3 (three) times daily with meals. (Patient not taking: Reported on 08/04/2020)  . [DISCONTINUED] insulin detemir (LEVEMIR FLEXTOUCH) 100 UNIT/ML FlexPen Inject 40 Units into the skin at bedtime. (Patient not taking: Reported on 08/04/2020)   No facility-administered encounter medications on file as of 08/04/2020.    Follow-up: Return in about 3 weeks (around 08/25/2020) for diabetic teaching f/u.   Kerin Perna, NP

## 2020-08-04 NOTE — Telephone Encounter (Signed)
Attempted to return his call.   No voicemail set up so unable to leave a message.

## 2020-08-04 NOTE — Patient Instructions (Signed)
Type 2 Diabetes Mellitus, Diagnosis, Adult Type 2 diabetes (type 2 diabetes mellitus) is a long-term disease. It may happen when there is one or both of these problems:  The pancreas does not make enough insulin.  The body does not react in a normal way to insulin that it makes. Insulin lets sugars go into cells in your body. If you have type 2 diabetes, sugars cannot get into your cells. Sugars build up in the blood. This causes high blood sugar. What are the causes? The exact cause of this condition is not known. What increases the risk? The following factors may make you more likely to develop this condition:  Having type 2 diabetes in your family.  Being overweight or very overweight.  Not being active.  Your body not reacting in a normal way to the insulin it makes.  Having higher than normal blood sugar over time.  Having a type of diabetes when you were pregnant.  Having a condition that causes small fluid-filled sacs on your ovaries. What are the signs or symptoms? At first, you may have no symptoms. You will get symptoms slowly. They may include:  More thirst than normal.  More hunger than normal.  Needing to pee more than normal.  Losing weight without trying.  Feeling tired.  Feeling weak.  Seeing things blurry.  Dark patches on your skin. How is this treated? This condition may be treated by a diabetes expert. You may need to:  Follow an eating plan made by a food expert (dietitian).  Get regular exercise.  Find ways to deal with stress.  Check blood sugar as often as told.  Take medicines. Your doctor will set treatment goals for you. Your blood sugar should be at these levels:  Before meals: 80-130 mg/dL (4.4-7.2 mmol/L).  After meals: below 180 mg/dL (10 mmol/L).  Over the last 2-3 months: less than 7%. Follow these instructions at home: Medicines  Take your diabetes medicines or insulin every day.  Take medicines to help you not get  other problems caused by this condition. You may need: ? Aspirin. ? Medicine to lower cholesterol. ? Medicine to control blood pressure. Questions to ask your doctor  Should I meet with a diabetes educator?  What medicines do I need, and when should I take them?  What will I need to treat my condition at home?  When should I check my blood sugar?  Where can I find a support group?  Who can I call if I have questions?  When is my next doctor visit? General instructions  Take over-the-counter and prescription medicines only as told by your doctor.  Keep all follow-up visits as told by your doctor. This is important. Where to find more information  American Diabetes Association (ADA): www.diabetes.org  American Association of Diabetes Care and Education Specialists (ADCES): www.diabeteseducator.org  International Diabetes Federation (IDF): www.idf.org Contact a doctor if:  Your blood sugar is at or above 240 mg/dL (13.3 mmol/L) for 2 days in a row.  You have been sick for 2 days or more, and you are not getting better.  You have had a fever for 2 days or more, and you are not getting better.  You have any of these problems for more than 6 hours: ? You cannot eat or drink. ? You feel like you may vomit. ? You vomit. ? You have watery poop (diarrhea). Get help right away if:  Your blood sugar is very low. This means it is lower   than 54 mg/dL (3 mmol/L).  You feel mixed up (confused).  You have trouble thinking clearly.  You have trouble breathing.  You have medium or large ketone levels in your pee. These symptoms may be an emergency. Do not wait to see if the symptoms will go away. Get medical help right away. Call your local emergency services (911 in the [place] Do not drive yourself to the hospital. Summary  Type 2 diabetes is a long-term disease. Your pancreas may not make enough insulin, or your body may not react in a normal way to insulin that it  makes.  This condition is treated with an eating plan, lifestyle changes, and medicines.  Your doctor will set treatment goals for you. These will help you keep your blood sugar in a healthy range.  Keep all follow-up visits as told by your doctor. This is important. This information is not intended to replace advice given to you by your health care provider. Make sure you discuss any questions you have with your health care provider. Document Revised: 10/23/2019 Document Reviewed: 10/23/2019 Elsevier Patient Education  2021 Elsevier Inc.  

## 2020-08-04 NOTE — Telephone Encounter (Signed)
Patient returned call- he was seen today in the office and had medication changes- he needs to know if he should start the new medication today. Advised patient to finish out today with his normal dosing and then start the new regimen tomorrow as instructed.(Reviewed Metformin instructions in chart)  He states he will do that.

## 2020-08-05 ENCOUNTER — Other Ambulatory Visit (INDEPENDENT_AMBULATORY_CARE_PROVIDER_SITE_OTHER): Payer: Self-pay | Admitting: Primary Care

## 2020-08-05 DIAGNOSIS — E782 Mixed hyperlipidemia: Secondary | ICD-10-CM

## 2020-08-05 LAB — CBC WITH DIFFERENTIAL/PLATELET
Basophils Absolute: 0.1 10*3/uL (ref 0.0–0.2)
Basos: 1 %
EOS (ABSOLUTE): 0.4 10*3/uL (ref 0.0–0.4)
Eos: 7 %
Hematocrit: 41.7 % (ref 37.5–51.0)
Hemoglobin: 13.5 g/dL (ref 13.0–17.7)
Immature Grans (Abs): 0 10*3/uL (ref 0.0–0.1)
Immature Granulocytes: 0 %
Lymphocytes Absolute: 1.8 10*3/uL (ref 0.7–3.1)
Lymphs: 32 %
MCH: 27.6 pg (ref 26.6–33.0)
MCHC: 32.4 g/dL (ref 31.5–35.7)
MCV: 85 fL (ref 79–97)
Monocytes Absolute: 0.5 10*3/uL (ref 0.1–0.9)
Monocytes: 9 %
Neutrophils Absolute: 2.8 10*3/uL (ref 1.4–7.0)
Neutrophils: 51 %
Platelets: 197 10*3/uL (ref 150–450)
RBC: 4.9 x10E6/uL (ref 4.14–5.80)
RDW: 14.5 % (ref 11.6–15.4)
WBC: 5.5 10*3/uL (ref 3.4–10.8)

## 2020-08-05 LAB — LIPID PANEL
Chol/HDL Ratio: 7.8 ratio — ABNORMAL HIGH (ref 0.0–5.0)
Cholesterol, Total: 288 mg/dL — ABNORMAL HIGH (ref 100–199)
HDL: 37 mg/dL — ABNORMAL LOW (ref >39)
LDL Chol Calc (NIH): 214 mg/dL — ABNORMAL HIGH (ref 0–99)
Triglycerides: 190 mg/dL — ABNORMAL HIGH (ref 0–149)
VLDL Cholesterol Cal: 37 mg/dL (ref 5–40)

## 2020-08-05 LAB — BASIC METABOLIC PANEL
BUN/Creatinine Ratio: 12 (ref 9–20)
BUN: 9 mg/dL (ref 6–20)
CO2: 22 mmol/L (ref 20–29)
Calcium: 9.9 mg/dL (ref 8.7–10.2)
Chloride: 103 mmol/L (ref 96–106)
Creatinine, Ser: 0.77 mg/dL (ref 0.76–1.27)
Glucose: 95 mg/dL (ref 65–99)
Potassium: 4.3 mmol/L (ref 3.5–5.2)
Sodium: 141 mmol/L (ref 134–144)
eGFR: 129 mL/min/{1.73_m2} (ref >59)

## 2020-08-05 LAB — MICROALBUMIN / CREATININE URINE RATIO
Creatinine, Urine: 155 mg/dL
Microalb/Creat Ratio: 79 mg/g creat — ABNORMAL HIGH (ref 0–29)
Microalbumin, Urine: 121.9 ug/mL

## 2020-08-05 MED ORDER — ATORVASTATIN CALCIUM 40 MG PO TABS: 40.0000 mg | ORAL_TABLET | Freq: Every day | ORAL | 3 refills | Status: DC

## 2020-08-06 ENCOUNTER — Telehealth (INDEPENDENT_AMBULATORY_CARE_PROVIDER_SITE_OTHER): Payer: Self-pay

## 2020-08-06 NOTE — Telephone Encounter (Signed)
Patient is aware of results and has already picked up the medication. Maryjean Morn, CMA

## 2020-08-06 NOTE — Telephone Encounter (Signed)
-----   Message from Grayce Sessions, NP sent at 08/05/2020  5:03 PM EDT ----- Elevated cholesterol can lead to heart attack and stroke. To lower your number you can decrease your fatty foods, red meat, cheese, milk and increase fiber like whole grains and veggies.  Sent in atorvastatin 40mg  take at bedtime

## 2020-08-19 ENCOUNTER — Ambulatory Visit: Payer: Medicaid Other

## 2020-08-26 ENCOUNTER — Other Ambulatory Visit: Payer: Self-pay

## 2020-08-26 ENCOUNTER — Ambulatory Visit (INDEPENDENT_AMBULATORY_CARE_PROVIDER_SITE_OTHER): Payer: Self-pay | Admitting: Primary Care

## 2020-08-26 VITALS — BP 115/76 | HR 94 | Temp 97.9°F | Wt 221.2 lb

## 2020-08-26 DIAGNOSIS — E119 Type 2 diabetes mellitus without complications: Secondary | ICD-10-CM

## 2020-08-26 DIAGNOSIS — E782 Mixed hyperlipidemia: Secondary | ICD-10-CM

## 2020-08-26 DIAGNOSIS — I10 Essential (primary) hypertension: Secondary | ICD-10-CM

## 2020-08-26 MED ORDER — NOVOLOG FLEXPEN 100 UNIT/ML ~~LOC~~ SOPN
PEN_INJECTOR | SUBCUTANEOUS | 6 refills | Status: DC
Start: 2020-08-26 — End: 2020-08-26

## 2020-08-26 MED ORDER — LEVEMIR FLEXTOUCH 100 UNIT/ML ~~LOC~~ SOPN
40.0000 [IU] | PEN_INJECTOR | Freq: Every day | SUBCUTANEOUS | 6 refills | Status: DC
  Filled 2020-08-26: qty 12, 30d supply, fill #0
  Filled 2020-09-22: qty 6, 15d supply, fill #1
  Filled 2020-10-16: qty 12, 30d supply, fill #2

## 2020-08-26 MED ORDER — NOVOLOG FLEXPEN 100 UNIT/ML ~~LOC~~ SOPN: PEN_INJECTOR | SUBCUTANEOUS | 6 refills | Status: DC

## 2020-08-26 MED ORDER — ATORVASTATIN CALCIUM 40 MG PO TABS
40.0000 mg | ORAL_TABLET | Freq: Every day | ORAL | 3 refills | Status: DC
  Filled 2020-08-26: qty 30, 30d supply, fill #0
  Filled 2020-09-23: qty 30, 30d supply, fill #1
  Filled 2020-11-09: qty 30, 30d supply, fill #2

## 2020-08-26 MED ORDER — METFORMIN HCL 1000 MG PO TABS
1000.0000 mg | ORAL_TABLET | Freq: Two times a day (BID) | ORAL | 3 refills | Status: DC
  Filled 2020-08-26: qty 60, 30d supply, fill #0
  Filled 2020-09-22: qty 60, 30d supply, fill #1

## 2020-08-26 MED ORDER — LISINOPRIL 20 MG PO TABS
20.0000 mg | ORAL_TABLET | Freq: Every day | ORAL | 3 refills | Status: DC
  Filled 2020-08-26: qty 30, 30d supply, fill #0
  Filled 2020-09-22: qty 30, 30d supply, fill #1

## 2020-08-26 NOTE — Patient Instructions (Signed)

## 2020-08-26 NOTE — Progress Notes (Signed)
Established Patient Office Visit  Subjective:  Patient ID: Ian Carrillo, male    DOB: February 20, 1996  Age: 25 y.o. MRN: [id number]  HPI Ian Carrillo is a 25 year old male who presents for management of diabetes.  He denies polydypsia, polyuria or any visual changes.  Past Medical History:  Diagnosis Date  . Diabetes mellitus without complication ([place])   62-229 fasting  No past surgical history on file.  No family history on file.  Social History   Socioeconomic History  . Marital status: Single    Spouse name: Not on file  . Number of children: Not on file  . Years of education: Not on file  . Highest education level: Not on file  Occupational History  . Not on file  Tobacco Use  . Smoking status: Never Smoker  . Smokeless tobacco: Never Used  Substance and Sexual Activity  . Alcohol use: Yes    Comment: quit 22 days ago  . Drug use: Not Currently  . Sexual activity: Not on file  Other Topics Concern  . Not on file  Social History Narrative  . Not on file   Social Determinants of Health   Financial Resource Strain: Not on file  Food Insecurity: Not on file  Transportation Needs: Not on file  Physical Activity: Not on file  Stress: Not on file  Social Connections: Not on file  Intimate Partner Violence: Not on file    Outpatient Medications Prior to Visit  Medication Sig Dispense Refill  . atorvastatin (LIPITOR) 40 MG tablet Take 1 tablet (40 mg total) by mouth daily. 90 tablet 3  . blood glucose meter kit and supplies KIT Dispense based on patient and insurance preference. Use up to four times daily as directed. 1 each 0  . Blood Glucose Monitoring Suppl (TRUE METRIX METER) w/Device KIT use as directed (Patient taking differently: use as directed) 1 kit 0  . cetirizine (ZYRTEC) 10 MG tablet Take 10 mg by mouth daily.    Marland Kitchen glucose blood (TRUE METRIX BLOOD GLUCOSE TEST) test strip use as directed (Patient taking differently: use as directed) 200 each 0  .  insulin aspart (NOVOLOG FLEXPEN) 100 UNIT/ML FlexPen For blood sugars 0-150 give 0 units of insulin,  201-250 give 6 units, 251-300 give 6 units, 301-350 give 10  units, 351-400 give 12  units,> 400 give 12 units and call M.D. Discussed hypoglycemia protocol. 15 mL 6  . insulin detemir (LEVEMIR FLEXTOUCH) 100 UNIT/ML FlexPen Inject 40 Units into the skin at bedtime. 15 mL 6  . Insulin Pen Needle 32G X 4 MM MISC Use as per directions, 3 times daily with meals and at bedtime. 200 each 3  . lisinopril (ZESTRIL) 20 MG tablet Take 1 tablet (20 mg total) by mouth daily. 90 tablet 3  . metFORMIN (GLUCOPHAGE) 1000 MG tablet Take 1 tablet (1,000 mg total) by mouth 2 (two) times daily with a meal. 180 tablet 3  . TRUEplus Lancets 28G MISC use as directed (Patient taking differently: use as directed) 100 each 0   No facility-administered medications prior to visit.    No Known Allergies  ROS Review of Systems  Neurological: Positive for headaches.       When doe not eat  All other systems reviewed and are negative.     Objective:    Physical Exam Vitals reviewed.  Constitutional:      Appearance: He is obese.  HENT:     Head: Normocephalic.  Right Ear: External ear normal.     Left Ear: External ear normal.     Nose: Nose normal.  Eyes:     Extraocular Movements: Extraocular movements intact.  Cardiovascular:     Rate and Rhythm: Normal rate and regular rhythm.  Pulmonary:     Effort: Pulmonary effort is normal.     Breath sounds: Normal breath sounds.  Abdominal:     General: Bowel sounds are normal. There is distension.     Palpations: Abdomen is soft.  Musculoskeletal:        General: Normal range of motion.     Cervical back: Normal range of motion and neck supple.  Skin:    General: Skin is warm and dry.  Neurological:     Mental Status: He is alert and oriented to person, place, and time.  Psychiatric:        Mood and Affect: Mood normal.        Behavior: Behavior  normal.        Thought Content: Thought content normal.        Judgment: Judgment normal.     There were no vitals taken for this visit. Wt Readings from Last 3 Encounters:  08/04/20 232 lb 6.4 oz (105.4 kg)  07/30/20 233 lb (105.7 kg)  07/14/20 234 lb 2.1 oz (106.2 kg)     Health Maintenance Due  Topic Date Due  . PNEUMOCOCCAL POLYSACCHARIDE VACCINE AGE 71-64 HIGH RISK  Never done  . COVID-19 Vaccine (1) Never done  . FOOT EXAM  Never done  . OPHTHALMOLOGY EXAM  Never done  . HPV VACCINES (1 - Male 2-dose series) Never done       Topic Date Due  . HPV VACCINES (1 - Male 2-dose series) Never done    Lab Results  Component Value Date   TSH 0.700 07/11/2020   Lab Results  Component Value Date   WBC 5.5 08/04/2020   HGB 13.5 08/04/2020   HCT 41.7 08/04/2020   MCV 85 08/04/2020   PLT 197 08/04/2020   Lab Results  Component Value Date   NA 141 08/04/2020   K 4.3 08/04/2020   CO2 22 08/04/2020   GLUCOSE 95 08/04/2020   BUN 9 08/04/2020   CREATININE 0.77 08/04/2020   CALCIUM 9.9 08/04/2020   ANIONGAP 11 07/15/2020   EGFR 129 08/04/2020   Lab Results  Component Value Date   CHOL 288 (H) 08/04/2020   Lab Results  Component Value Date   HDL 37 (L) 08/04/2020   Lab Results  Component Value Date   LDLCALC 214 (H) 08/04/2020   Lab Results  Component Value Date   TRIG 190 (H) 08/04/2020   Lab Results  Component Value Date   CHOLHDL 7.8 (H) 08/04/2020   Lab Results  Component Value Date   HGBA1C 12.8 (H) 07/11/2020      Assessment & Plan:  Diagnoses and all orders for this visit:  Type 2 diabetes mellitus without complication, with long-term current use of insulin ([facility]) -     metFORMIN (GLUCOPHAGE) 1000 MG tablet; Take 1 tablet (1,000 mg total) by mouth 2 (two) times daily with a meal. -     insulin detemir (LEVEMIR FLEXTOUCH) 100 UNIT/ML FlexPen; Inject 40 Units into the skin at bedtime. -     Discontinue: insulin aspart (NOVOLOG FLEXPEN) 100  UNIT/ML FlexPen; For blood sugars 0-150 give 0 units of insulin,  201-250 give 6 units, 251-300 give 6 units, 301-350 give 10  units, 351-400 give 12  units,> 400 give 12 units and call M.D. Discussed hypoglycemia protocol. -     insulin aspart (NOVOLOG FLEXPEN) 100 UNIT/ML FlexPen; inject three times a day after meals. For blood sugars 0-150 give 0 units of insulin,  201-250 give 6 units, 251-300 give 6 units, 301-350 give 10  units, 351-400 give 12  units,> 400 give 12 units and call M.D. Discussed hypoglycemia protocol.  Essential hypertension Continue all antihypertensives as prescribed.  Remember to bring in your blood pressure log with you for your follow up appointment.  DASH/Mediterranean Diets are healthier choices for HTN.   -     lisinopril (ZESTRIL) 20 MG tablet; Take 1 tablet (20 mg total) by mouth daily.  Comprehensive diabetic foot examination, type 2 DM, encounter for [facility]) -Completed  Mixed hyperlipidemia Encouraged heart healthy diet, increase exercise, avoid trans fats, consider a krill oil capsule daily -     atorvastatin (LIPITOR) 40 MG tablet; Take 1 tablet (40 mg total) by mouth daily.    No orders of the defined types were placed in this encounter.   Follow-up: No follow-ups on file.    Kerin Perna, NP

## 2020-08-27 ENCOUNTER — Encounter (INDEPENDENT_AMBULATORY_CARE_PROVIDER_SITE_OTHER): Payer: Self-pay | Admitting: Primary Care

## 2020-09-02 ENCOUNTER — Inpatient Hospital Stay: Payer: Medicaid Other | Admitting: Physician Assistant

## 2020-09-14 ENCOUNTER — Other Ambulatory Visit: Payer: Self-pay

## 2020-09-21 ENCOUNTER — Ambulatory Visit: Payer: Medicaid Other | Admitting: Dietician

## 2020-09-22 ENCOUNTER — Other Ambulatory Visit (INDEPENDENT_AMBULATORY_CARE_PROVIDER_SITE_OTHER): Payer: Self-pay | Admitting: Primary Care

## 2020-09-22 ENCOUNTER — Other Ambulatory Visit: Payer: Self-pay

## 2020-09-22 DIAGNOSIS — E119 Type 2 diabetes mellitus without complications: Secondary | ICD-10-CM

## 2020-09-22 DIAGNOSIS — I10 Essential (primary) hypertension: Secondary | ICD-10-CM

## 2020-09-22 NOTE — Telephone Encounter (Signed)
Medication Refill - Medication: insulin detemir (LEVEMIR FLEXTOUCH) 100 UNIT/ML FlexPen metFORMIN (GLUCOPHAGE) 1000 MG tablet lisinopril (ZESTRIL) 20 MG tablet    Preferred Pharmacy (with phone number or street name):  [facility] Phone:  [phone]  Fax:  [phone]      Agent: Please be advised that RX refills may take up to 3 business days. We ask that you follow-up with your pharmacy.

## 2020-09-22 NOTE — Telephone Encounter (Signed)
Pharmacy have scripts on file and will get ready for patient

## 2020-09-23 ENCOUNTER — Other Ambulatory Visit: Payer: Self-pay

## 2020-10-07 ENCOUNTER — Other Ambulatory Visit: Payer: Self-pay

## 2020-10-14 ENCOUNTER — Other Ambulatory Visit: Payer: Self-pay

## 2020-10-16 ENCOUNTER — Other Ambulatory Visit: Payer: Self-pay

## 2020-10-16 ENCOUNTER — Telehealth (INDEPENDENT_AMBULATORY_CARE_PROVIDER_SITE_OTHER): Payer: Self-pay | Admitting: Primary Care

## 2020-10-16 ENCOUNTER — Other Ambulatory Visit (INDEPENDENT_AMBULATORY_CARE_PROVIDER_SITE_OTHER): Payer: Self-pay | Admitting: Primary Care

## 2020-10-16 DIAGNOSIS — E119 Type 2 diabetes mellitus without complications: Secondary | ICD-10-CM

## 2020-10-16 NOTE — Telephone Encounter (Signed)
Pharmacy will get script ready for patient. Patient has refills on file

## 2020-10-16 NOTE — Telephone Encounter (Signed)
Pt is calling to speak to luke regarding the cost of his Levemir. Please advise Cb- [phone]

## 2020-10-16 NOTE — Telephone Encounter (Signed)
Medication: Rx #: [id number] insulin detemir (LEVEMIR FLEXTOUCH) 100 UNIT/ML FlexPen [[id number]]    Has the patient contacted their pharmacy? YES  (Agent: If no, request that the patient contact the pharmacy for the refill.) (Agent: If yes, when and what did the pharmacy advise?)  Preferred Pharmacy (with phone number or street name): [facility] [address] [place] [address] Phone: [phone] Fax: [phone] Hours: M-F 8:30a-5:30p    Agent: Please be advised that RX refills may take up to 3 business days. We ask that you follow-up with your pharmacy.

## 2020-10-19 ENCOUNTER — Telehealth (INDEPENDENT_AMBULATORY_CARE_PROVIDER_SITE_OTHER): Payer: Self-pay

## 2020-10-19 ENCOUNTER — Other Ambulatory Visit: Payer: Self-pay

## 2020-10-19 NOTE — Telephone Encounter (Signed)
I do not know this patient. It appears that his Medicaid is family planning only, however, his rxs were sent here. Therefore, he should be enrolled in patient assistance if he in uninsured. I have routed our MAP coordinator in this thread.   Tresa Endo,   Is this someone that we have enrolled in patient assistance?

## 2020-10-19 NOTE — Telephone Encounter (Signed)
Copied from CRM [id number]. Topic: General - Other >> Oct 19, 2020  2:06 PM Jaquita Rector A wrote: Reason for CRM: Patient would like a call back from Gwinda Passe to discuss him not taking hisinsulin detemir (LEVEMIR FLEXTOUCH) 100 UNIT/ML FlexPen. Patient can be reached at   Ph#  [phone]

## 2020-10-21 NOTE — Telephone Encounter (Signed)
Patient was out of medications over the stopped Levimir fasting 88-111. Changed diet/exercising feels great. Change dose to 20 units until f/u appt. Denies any poly - phagia, dipsia, urine or vision problems.  A1C on October 28, 2020

## 2020-10-28 ENCOUNTER — Other Ambulatory Visit: Payer: Self-pay

## 2020-10-28 ENCOUNTER — Ambulatory Visit (INDEPENDENT_AMBULATORY_CARE_PROVIDER_SITE_OTHER): Payer: Self-pay | Admitting: Primary Care

## 2020-10-28 ENCOUNTER — Encounter (INDEPENDENT_AMBULATORY_CARE_PROVIDER_SITE_OTHER): Payer: Self-pay | Admitting: Primary Care

## 2020-10-28 VITALS — BP 123/71 | HR 82 | Temp 98.3°F | Wt 198.6 lb

## 2020-10-28 DIAGNOSIS — E119 Type 2 diabetes mellitus without complications: Secondary | ICD-10-CM

## 2020-10-28 DIAGNOSIS — I10 Essential (primary) hypertension: Secondary | ICD-10-CM

## 2020-10-28 LAB — POCT GLYCOSYLATED HEMOGLOBIN (HGB A1C): Hemoglobin A1C: 5.6 % (ref 4.0–5.6)

## 2020-10-28 MED ORDER — INSULIN DETEMIR 100 UNIT/ML ~~LOC~~ SOLN
10.0000 [IU] | Freq: Every day | SUBCUTANEOUS | 3 refills | Status: DC
Start: 2020-10-28 — End: 2020-11-18
  Filled 2020-10-28: qty 10, 42d supply, fill #0

## 2020-10-28 MED ORDER — LISINOPRIL 20 MG PO TABS
20.0000 mg | ORAL_TABLET | Freq: Every day | ORAL | 3 refills | Status: DC
  Filled 2020-10-28 – 2020-11-09 (×2): qty 30, 30d supply, fill #0

## 2020-10-28 NOTE — Progress Notes (Signed)
[facility]     Ian Carrillo is a 25 y.o. male who presents for an follow up  evaluation of Type 2 diabetes mellitus.  Current symptoms/problems include none  Current diabetic medications include oral agent (monotherapy): metformin (generic) and insulin injections: NPH: 40  . Levimir   The patient was initially diagnosed with Type 2 diabetes mellitus based on the following criteria:  ADA guidelines .  Current monitoring regimen: home blood tests - 4 times daily Home blood sugar records: fasting range: 74-90 ( reduced Levimir 20units )  prior to visit Any episodes of hypoglycemia? no  Known diabetic complications: none Cardiovascular risk factors: diabetes mellitus and male gender Eye exam current (within one year): no Weight trend: decreasing steadily watching diet and excercising  Prior visit with CDE: yes -  Current diet: in general, a "healthy" diet   Current exercise: cardiovascular workout on exercise equipment, walking, and weightlifting Medication Compliance?  Yes   Is He on ACE inhibitor or angiotensin II receptor blocker?  Yes  lisinopril (generic)   Review of Systems  All other systems reviewed and are negative.  Objective:    BP 123/71 (BP Location: Right Arm, Patient Position: Sitting, Cuff Size: Normal)   Pulse 82   Temp 98.3 F (36.8 C) (Oral)   Wt 198 lb 9.6 oz (90.1 kg)   SpO2 100%   BMI 31.11 kg/m   Physical Exam  General: Vital signs reviewed.  Patient is well-developed and well-nourished, in no acute distress and cooperative with exam.  Head: Normocephalic and atraumatic. Eyes: EOMI, conjunctivae normal, no scleral icterus.  Neck: Supple, trachea midline, normal ROM, no JVD, masses, thyromegaly, or carotid bruit present.  Cardiovascular: RRR, S1 normal, S2 normal, no murmurs, gallops, or rubs. Pulmonary/Chest: Clear to auscultation bilaterally, no wheezes, rales, or rhonchi. Abdominal: Soft, non-tender, non-distended, BS +, no  masses, organomegaly, or guarding present.  Musculoskeletal: No joint deformities, erythema, or stiffness, ROM full and nontender. Extremities: No lower extremity edema bilaterally,  pulses symmetric and intact bilaterally. No cyanosis or clubbing. Neurological: A&O x3, Strength is normal and symmetric bilaterally Skin: Warm, dry and intact. No rashes or erythema. Psychiatric: Normal mood and affect. speech and behavior is normal. Cognition and memory are normal.   Lab Review Glucose (mg/dL)  Date Value  40/98/1191 95   Glucose, Bld (mg/dL)  Date Value  47/82/9562 249 (H)  07/14/2020 300 (H)  07/14/2020 252 (H)   CO2 (mmol/L)  Date Value  08/04/2020 22  07/15/2020 19 (L)  07/14/2020 20 (L)   BUN (mg/dL)  Date Value  13/11/6576 9  07/15/2020 7  07/14/2020 8  07/14/2020 9   Creatinine, Ser (mg/dL)  Date Value  46/96/2952 0.77  07/15/2020 0.81  07/14/2020 0.82      Assessment:    Diabetes Mellitus type II, under good control.   Diagnoses and all orders for this visit:  Type 2 diabetes mellitus without complication, with long-term current use of insulin (HCC) -     insulin detemir (LEVEMIR) 100 UNIT/ML injection; Inject 0.1 mLs (10 Units total) into the skin daily. -     HgB A1c  Essential hypertension Blood pressure is a goal of less than 130/80, monotherapy - ACE- Lisinopril 20mg  daily and follows a low-sodium, DASH diet, medication compliance, 150 minutes of moderate intensity exercise per week. Discussed medication compliance, adverse effects.     Plan:    1.  Rx changes: levimir 10 units daily and 500 mg of metformin after  meals ( break metformin 1000mg  I/2 bid) 2.  Education: Reviewed 'ABCs' of diabetes management (respective goals in parentheses):  A1C (<7), blood pressure (<130/80), and cholesterol (LDL <100). Counseled Patient on the risk factors of co- morbidities with uncontrol diabetes  Complications -diabetic retinopathy, (close your eyes ? What do  you see nothing) nephropathy decrease in kidney function- can lead to dialysis-on a machine 3 days a week to filter your kidney, neuropathy- numbness and tinging in your hands and feet,  increase risk of heart attack and stroke, and amputation due to decrease wound healing and circulation. Decrease your risk by taking medication daily as prescribed, monitor carbohydrates- foods that are high in carbohydrates are the following rice, potatoes, breads, sugars, and pastas.  Reduction in the intake (eating) will assist in lowering your blood sugars. Exercise daily at least 30 minutes daily.   3. CHO counting diet discussed.  Reviewed CHO amount in various foods and how to read nutrition labels.  Discussed recommended serving sizes.   4.  Recommend check BG 3  times a day  5. Recommended increase physical activity - goal is 150 minutes per week    This note has been created with . Any transcriptional errors are unintentional.   Education officer, environmental, NP 10/28/2020, 9:59 AM

## 2020-11-04 ENCOUNTER — Other Ambulatory Visit: Payer: Self-pay

## 2020-11-09 ENCOUNTER — Other Ambulatory Visit: Payer: Self-pay

## 2020-11-09 ENCOUNTER — Ambulatory Visit: Payer: Self-pay

## 2020-11-09 NOTE — Telephone Encounter (Signed)
Per agent:   Patient states he was unaware PCP d/c metFORMIN (GLUCOPHAGE) 1000 MG tablet . Patient states he tried to call the pharmacy to request a refill and was informed by the pharmacist medication has been d/c. Patient states most recent lab results were looking good but did not know PCP was going to stop medication. Patient seeking confirmation that metFORMIN (GLUCOPHAGE) 1000 MG tablet  has been stopped      Able to reach out to pt. He states at last OV 10/28/20 he was told to take 500mg  Metformin (Break the 1000mg  tab in half.) Noted encounter in last OV notes. Pt states he is confused if he should continue the 500mg  or is it discontinued all together. States he has 6 tabs of the 1000mg  left. Assured pt NT would route to practice for PCPs review. CB# [phone]      Reason for Disposition  [1] Caller has NON-URGENT medicine question about med that PCP prescribed AND [2] triager unable to answer question  Answer Assessment - Initial Assessment Questions 1. NAME of MEDICATION: "What medicine are you calling about?"     Metformin. 2. QUESTION: "What is your question?" (e.g., double dose of medicine, side effect)    Pharmacy said it was discontinued 3. PRESCRIBING HCP: "Who prescribed it?" Reason: if prescribed by specialist, call should be referred to that group.     *No Answer* 4. SYMPTOMS: "Do you have any symptoms?"     *No Answer* 5. SEVERITY: If symptoms are present, ask "Are they mild, moderate or severe?"     *No Answer* 6. PREGNANCY:  "Is there any chance that you are pregnant?" "When was your last menstrual period?"     *No Answer*  Protocols used: Medication Question Call-A-AH

## 2020-11-10 ENCOUNTER — Other Ambulatory Visit (INDEPENDENT_AMBULATORY_CARE_PROVIDER_SITE_OTHER): Payer: Self-pay | Admitting: Primary Care

## 2020-11-10 DIAGNOSIS — E119 Type 2 diabetes mellitus without complications: Secondary | ICD-10-CM

## 2020-11-10 MED ORDER — METFORMIN HCL 500 MG PO TABS: 500.0000 mg | ORAL_TABLET | Freq: Two times a day (BID) | ORAL | 1 refills | Status: DC

## 2020-11-10 MED ORDER — METFORMIN HCL 1000 MG PO TABS: 1000.0000 mg | ORAL_TABLET | Freq: Two times a day (BID) | ORAL | 1 refills | Status: DC

## 2020-11-10 NOTE — Telephone Encounter (Signed)
Per last ov patient was to continue metformin but it was not refilled.

## 2020-11-16 ENCOUNTER — Other Ambulatory Visit: Payer: Self-pay

## 2020-11-16 NOTE — Progress Notes (Signed)
S:    PCP: Gwinda Passe, NP  Patient presents for diabetes evaluation, education, and management. Was admitted to the ICU April 2022 with DKA, new onset DM. Patient was referred and last seen by Primary Care Provider on 10/28/20. Levemir was decreased from 20 to 10 units daily due to low fasting BG. Metformin was decreased to 1000 mg BID to 500 mg BID (patients reports he tolerated 1000 mg BID, it was decreased due to improved BG control). A1c at that visit improved to 5.6 from 12.8 in April at diagnosis.   Today, patient arrives in good spirits. Asks to weigh himself on our scale which shows he has lost 75 lbs since April. Reports he has made significant lifestyle changes in his diet and exercise. He is very mindful of what he eats and exercises frequently. He has educated himself about diabetes since his diagnosis and is extremely motivated to keep it under control.   Patient reports Diabetes was diagnosed in April 2022.   Insurance coverage/medication affordability: None  Family & Social History:  -Mother and grandmother had DM -Stopped drinking alcohol in February  Medication adherence reported with no missed doses.   Current diabetes medications include: metformin 500 mg BID, Levemir 10 units nightly, Novolog TIDWC sliding scale (does not take because he has never had a BG >150) Current hypertension medications include: lisinopril 20 mg daily Current hyperlipidemia medications include: atorvastatin 40 mg daily  Patient denies hypoglycemic events.  Patient reported dietary habits: Eats 3 meals/day Used to eat a no carb diet but when he was drastically dropping weight he reincorporated wheat wraps, sandwich, some rice as carbs. Eats a lot of protein and vegetables. Adherence to the plate method (1/4 plate protein, 1/4 starch, 1/2 vegetables). Used to drink alcohol nightly but has completely cut it out since February. He does not eat after 9pm, fasts until breakfast. Used to drink  lots of sugary sodas and juices, now only drinks Diet Dr. Reino Kent if he has a soda but drinks lots of water.   Patient-reported exercise habits: 3x/week. cardiovascular workout on exercise equipment, walking, and weightlifting. 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    Patient denies nocturia (nighttime urination).  Patient denies neuropathy (nerve pain). Patient denies visual changes. Patient reports self foot exams.    O:  POCT BG: 89 mg/dL (fasting)  Lab Results  Component Value Date   HGBA1C 5.6 10/28/2020   There were no vitals filed for this visit.  Lipid Panel     Component Value Date/Time   CHOL 288 (H) 08/04/2020 1053   TRIG 190 (H) 08/04/2020 1053   HDL 37 (L) 08/04/2020 1053   CHOLHDL 7.8 (H) 08/04/2020 1053   LDLCALC 214 (H) 08/04/2020 1053   Home fasting blood sugars: 80s-90s, highest 103, lowest 85  2 hour post-meal/random blood sugars: 80s-100s  Clinical Atherosclerotic Cardiovascular Disease (ASCVD): No  The ASCVD Risk score Denman George DC Jr., et al., 2013) failed to calculate for the following reasons:   The 2013 ASCVD risk score is only valid for ages 58 to 52   A/P: Diabetes recently diagnosed in April currently controlled with most recent A1c 5.6 (down from 12.8 at time of diagnosis) and all home BG are at goal. Patient is able to verbalize appropriate hypoglycemia management plan. Medication adherence appears optimal. Given significant lifestyle changes that have contributed to 75 lb weight loss and improved BG, patient likely no longer requires insulin to maintain  controlled BG and is only putting him at risk for hypoglycemic events. Will discontinue insulin and increase metformin. He is agreeable to this plan and encouraged to no longer need insulin.  -Discontinue Novolog since he has had no BG >150 and therefore not requiring it -Discontinue Levemir -Increase metformin to 1000 mg BID (two 500 mg tablets) -Close  follow up scheduled for 4 weeks to ensure maintaining good BG control with these changes -Extensively discussed pathophysiology of diabetes, recommended lifestyle interventions, dietary effects on blood sugar control. Encouraged him to keep up the great work with his diet and exercise.  -Counseled on s/sx of and management of hypoglycemia -Next A1C anticipated 01/27/21 at next PCP visit.   Total time in face to face counseling 30 minutes.   Follow up Pharmacist visit in 4 weeks, then PCP visit in October.   [name], PharmD PGY2 Ambulatory Care Pharmacy Resident 11/18/2020 9:35 AM

## 2020-11-18 ENCOUNTER — Other Ambulatory Visit: Payer: Self-pay

## 2020-11-18 ENCOUNTER — Ambulatory Visit: Payer: Self-pay | Attending: Family Medicine | Admitting: Pharmacist

## 2020-11-18 ENCOUNTER — Encounter: Payer: Self-pay | Admitting: Pharmacist

## 2020-11-18 DIAGNOSIS — E119 Type 2 diabetes mellitus without complications: Secondary | ICD-10-CM

## 2020-11-18 LAB — GLUCOSE, POCT (MANUAL RESULT ENTRY): POC Glucose: 89 mg/dl (ref 70–99)

## 2020-11-18 MED ORDER — METFORMIN HCL 500 MG PO TABS: 1000.0000 mg | ORAL_TABLET | Freq: Two times a day (BID) | ORAL | 1 refills | Status: AC

## 2020-12-15 NOTE — Progress Notes (Signed)
S:    PCP: Gwinda Passe, NP  Patient presents for diabetes evaluation, education, and management. Was admitted to the ICU April 2022 with DKA, new onset DM. Patient was referred and last seen by Primary Care Provider on 10/28/20. A1c improved to 5.6 from 12.8 in April at diagnosis after significant lifestyle modifications and 75 lb weight loss. At last CPP visit on 8/10, discontinued Levemir and Novolog, and increased metformin to 1000 mg BID to consolidate his regimen given improved BG control with lifestyle.   Today, patient arrives in good spirits. Reports BG range 88 to 103, no highs or lows, since stopping insulin at last visit. Hoping to maintain weight in 190-200 lb range, 195.4 lb today. Tolerating increased dose of metformin well with no adverse effects or missed doses. He is hopeful to get off all medications in the near future since he has made such significant lifestyle changes.   Patient reports Diabetes was diagnosed in April 2022.   Insurance coverage/medication affordability: None  Family & Social History:  -Mother and grandmother had DM -Stopped drinking alcohol in February  Medication adherence reported with no missed doses.   Current diabetes medications include: metformin 1000 mg BID Current hypertension medications include: lisinopril 20 mg daily Current hyperlipidemia medications include: atorvastatin 40 mg daily  Patient denies hypoglycemic events.  Patient reported dietary habits: Eats 3 meals/day Used to eat a no carb diet but when he was drastically dropping weight he reincorporated wheat wraps, sandwich, some rice as carbs. Eats a lot of protein and vegetables. Adheres to the plate method (1/4 plate protein, 1/4 starch, 1/2 vegetables). Used to drink alcohol nightly but has completely cut it out since February. He does not eat after 9pm, fasts until breakfast. Used to drink lots of sugary sodas and juices, switched to diet sodas. He has now completely cut out  diet sodas as well and only drinks water.   Patient-reported exercise habits: 3x/week. cardiovascular workout on exercise equipment, walking, and weightlifting. 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    Patient denies nocturia (nighttime urination).  Patient denies neuropathy (nerve pain). Patient denies visual changes. Patient reports self foot exams.   O:   Lab Results  Component Value Date   HGBA1C 5.6 10/28/2020   There were no vitals filed for this visit.  Lipid Panel     Component Value Date/Time   CHOL 288 (H) 08/04/2020 1053   TRIG 190 (H) 08/04/2020 1053   HDL 37 (L) 08/04/2020 1053   CHOLHDL 7.8 (H) 08/04/2020 1053   LDLCALC 214 (H) 08/04/2020 1053   Home fasting blood sugars: 88, 91, 103 (highest seen was 103)   Clinical Atherosclerotic Cardiovascular Disease (ASCVD): No  The ASCVD Risk score Denman George DC Jr., et al., 2013) failed to calculate for the following reasons:   The 2013 ASCVD risk score is only valid for ages 83 to 53   A/P: Diabetes recently diagnosed in April currently controlled with most recent A1c 5.6 (down from 12.8 at time of diagnosis) and all home BG are at goal. Patient is able to verbalize appropriate hypoglycemia management plan. Medication adherence appears optimal. Given significant lifestyle changes that have contributed to 75 lb weight loss and improved BG, patient likely able to control DM with diet and exercise alone over time. Will continue current dose of metformin until next A1c check at PCP visit in October, then plan to start titrating down metformin slowly while closely monitoring  BG to ensure he maintains adequate control.  -Continue metformin 1000 mg BID (two 500 mg tablets) -If A1c is stable at PCP visit in October, plan to decrease metformin to 1000 mg in the morning and 500 mg in the evening. Follow up with Ian Carrillo after that visit to monitor BG closely as we continue to  titrate down metformin.  -Continue monitoring BG daily -Extensively discussed pathophysiology of diabetes, recommended lifestyle interventions, dietary effects on blood sugar control. Encouraged him to keep up the great work with his diet and exercise.  -Counseled on s/sx of and management of hypoglycemia -Next A1C anticipated 01/27/21 at next PCP visit.   Total time in face to face counseling 30 minutes.   Follow up PCP visit 10/19, then pharmacy visit 11/2.   [name], PharmD PGY2 Ambulatory Care Pharmacy Resident 12/16/2020 11:40 AM

## 2020-12-16 ENCOUNTER — Other Ambulatory Visit: Payer: Self-pay

## 2020-12-16 ENCOUNTER — Ambulatory Visit: Payer: Self-pay | Attending: Primary Care | Admitting: Pharmacist

## 2020-12-16 VITALS — Wt 195.4 lb

## 2020-12-16 DIAGNOSIS — E119 Type 2 diabetes mellitus without complications: Secondary | ICD-10-CM

## 2021-01-27 ENCOUNTER — Ambulatory Visit (INDEPENDENT_AMBULATORY_CARE_PROVIDER_SITE_OTHER): Payer: Medicaid Other | Admitting: Primary Care

## 2021-02-08 NOTE — Progress Notes (Unsigned)
S:    PCP: Gwinda Passe, NP  Patient presents for diabetes evaluation, education, and management. Was admitted to the ICU April 2022 with DKA, new onset DM. Patient was referred and last seen by Primary Care Provider on 10/28/20. A1c improved to 5.6 from 12.8 in April at diagnosis after significant lifestyle modifications and 75 lb weight loss. Seen twice now by CPP. At last visit on 9/7, BG at goal, current medications were continued with plan to start titrating medications down if A1c at goal per patient request given control with diet/exercise changes.   Today, *** -no showed visit with michelle 10/19 - due for A1c; our plan for that visit was "If A1c is stable at PCP visit in October, plan to decrease metformin to 1000 mg in the morning and 500 mg in the evening. Follow up with Franky Macho already scheduled after that visit to monitor BG closely as we continue to titrate down metformin" -any changes in diet/exercise?  Patient reports Diabetes was diagnosed in April 2022.   Insurance coverage/medication affordability: None  Family & Social History:  -Mother and grandmother had DM -Stopped drinking alcohol in February  Medication adherence reported with no missed doses.   Current diabetes medications include: metformin 1000 mg BID Current hypertension medications include: lisinopril 20 mg daily Current hyperlipidemia medications include: atorvastatin 40 mg daily  Patient denies hypoglycemic events.  Patient reported dietary habits: Eats 3 meals/day Used to eat a no carb diet but when he was drastically dropping weight he reincorporated wheat wraps, sandwich, some rice as carbs. Eats a lot of protein and vegetables. Adheres to the plate method (1/4 plate protein, 1/4 starch, 1/2 vegetables). Used to drink alcohol nightly but has completely cut it out since February. He does not eat after 9pm, fasts until breakfast. Used to drink lots of sugary sodas and juices, switched to diet sodas. He  has now completely cut out diet sodas as well and only drinks water.   Patient-reported exercise habits: 3x/week. cardiovascular workout on exercise equipment, walking, and weightlifting. 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    Patient denies nocturia (nighttime urination).  Patient denies neuropathy (nerve pain). Patient denies visual changes. Patient reports self foot exams.   O:   Lab Results  Component Value Date   HGBA1C 5.6 10/28/2020   There were no vitals filed for this visit.  Lipid Panel     Component Value Date/Time   CHOL 288 (H) 08/04/2020 1053   TRIG 190 (H) 08/04/2020 1053   HDL 37 (L) 08/04/2020 1053   CHOLHDL 7.8 (H) 08/04/2020 1053   LDLCALC 214 (H) 08/04/2020 1053   Home fasting blood sugars: 88, 91, 103 (highest seen was 103)   Clinical Atherosclerotic Cardiovascular Disease (ASCVD): No  The ASCVD Risk score (Arnett DK, et al., 2019) failed to calculate for the following reasons:   The 2019 ASCVD risk score is only valid for ages 66 to 22   A/P: Diabetes recently diagnosed in April currently controlled with most recent A1c 5.6 (down from 12.8 at time of diagnosis) and all home BG are at goal. Patient is able to verbalize appropriate hypoglycemia management plan. Medication adherence appears optimal. Given significant lifestyle changes that have contributed to 75 lb weight loss and improved BG, patient likely able to control DM with diet and exercise alone over time. Will continue current dose of metformin until next A1c check at PCP visit in October, then plan to  start titrating down metformin slowly while closely monitoring BG to ensure he maintains adequate control.  -Continue metformin 1000 mg BID (two 500 mg tablets) -If A1c is stable at PCP visit in October, plan to decrease metformin to 1000 mg in the morning and 500 mg in the evening. Follow up with Franky Macho already scheduled after that visit to monitor BG  closely as we continue to titrate down metformin.  -Continue monitoring BG daily -Extensively discussed pathophysiology of diabetes, recommended lifestyle interventions, dietary effects on blood sugar control. Encouraged him to keep up the great work with his diet and exercise.  -Counseled on s/sx of and management of hypoglycemia -Next A1C anticipated 01/27/21 at next PCP visit.   Total time in face to face counseling 30 minutes.   Follow up PCP visit ***.   [name], PharmD PGY2 Ambulatory Care Pharmacy Resident 02/08/2021 1:17 PM

## 2021-02-10 ENCOUNTER — Ambulatory Visit: Payer: Medicaid Other | Admitting: Pharmacist

## 2021-07-26 ENCOUNTER — Other Ambulatory Visit: Payer: Self-pay | Admitting: Family Medicine

## 2021-07-26 DIAGNOSIS — E119 Type 2 diabetes mellitus without complications: Secondary | ICD-10-CM

## 2021-07-26 NOTE — Telephone Encounter (Signed)
Patient answered and said I had the wrong number. Then he changed his mind and asked what I was calling about. I explained to set up appt and get meds filled. Pt states he is no longer on medication. ?

## 2022-02-02 ENCOUNTER — Ambulatory Visit (INDEPENDENT_AMBULATORY_CARE_PROVIDER_SITE_OTHER): Payer: Self-pay | Admitting: *Deleted

## 2022-02-02 NOTE — Telephone Encounter (Signed)
  Chief Complaint: Heart is racing and feeling a discomfort but not a pain in my chest. Symptoms: Feels like his heart is pounding and racing.  Frequency: Going on for the last hour and not letting up. Pertinent Negatives: Patient denies dizziness/lightheadedness/ sweating, shortness of breath though a "little winded". Disposition: [x] ED /[] Urgent Care (no appt availability in office) / [] Appointment(In office/virtual)/ []  [place] [facility]/ [] Home Care/ [] Refused Recommended Disposition /[] [place] [facility]/ []  Follow-up with PCP Additional Notes: Referred to ED which he was agreeable to doing.   He is going to have his girlfriend drive him to [facility].

## 2022-02-02 NOTE — Telephone Encounter (Signed)
Reason for Disposition  [1] Heart beating very rapidly (e.g., > 140 / minute) AND [2] present now  (Exception: During exercise.)  Answer Assessment - Initial Assessment Questions 1. BLOOD GLUCOSE: "What is your blood glucose level?"      My blood sugar is high.   I feel like my heart is racing.   It's trying to jump out of my chest.   BS 176.   I need to relax.  My heart has discomfort just my heart is racing.    This has never happened.  A little short of breath.   Not sweating.    When walking around feel like my heart is racing.   My blood sugar 176 now.     I ate a hot dog and FF and diet drink.    I went to the bathroom and it started not with a BM.   For an hour now my heart has been racing.   2. ONSET: "When did you check the blood glucose?"     Just now. Changed to the rapid heart rate protocol.  3. USUAL RANGE: "What is your glucose level usually?" (e.g., usual fasting morning value, usual evening value)     *No Answer* 4. KETONES: "Do you check for ketones (urine or blood test strips)?" If Yes, ask: "What does the test show now?"      *No Answer* 5. TYPE 1 or 2:  "Do you know what type of diabetes you have?"  (e.g., Type 1, Type 2, Gestational; doesn't know)      *No Answer* 6. INSULIN: "Do you take insulin?" "What type of insulin(s) do you use? What is the mode of delivery? (syringe, pen; injection or pump)?"      *No Answer* 7. DIABETES PILLS: "Do you take any pills for your diabetes?" If Yes, ask: "Have you missed taking any pills recently?"     *No Answer* 8. OTHER SYMPTOMS: "Do you have any symptoms?" (e.g., fever, frequent urination, difficulty breathing, dizziness, weakness, vomiting)     *No Answer* 9. PREGNANCY: "Is there any chance you are pregnant?" "When was your last menstrual period?"     *No Answer*  Answer Assessment - Initial Assessment Questions 1. DESCRIPTION: "Please describe your heart rate or heartbeat that you are having" (e.g., fast/slow,  regular/irregular, skipped or extra beats, "palpitations")     I feel like my heart is racing.  I'm not really short of breath but a little winded.   Denies chest pain but I feel a discomfort.  Not sweating but did take off his jacket just now.     2. ONSET: "When did it start?" (Minutes, hours or days)      A hour ago and it's not letting up.   It started while I was using the bathroom and hasn't stopped.   I asked if he was having a BM when this started he said,  "No". 3. DURATION: "How long does it last" (e.g., seconds, minutes, hours)     Going on for an hour now.    Never had it happen like this and for this long and the discomfort. 4. PATTERN "Does it come and go, or has it been constant since it started?"  "Does it get worse with exertion?"   "Are you feeling it now?"     Constant for the past hour. 5. TAP: "Using your hand, can you tap out what you are feeling on a chair or table in front of you, so that  I can hear?" (Note: not all patients can do this)       Not asked 6. HEART RATE: "Can you tell me your heart rate?" "How many beats in 15 seconds?"  (Note: not all patients can do this)       Not asked 7. RECURRENT SYMPTOM: "Have you ever had this before?" If Yes, ask: "When was the last time?" and "What happened that time?"      Yes but never like this. 8. CAUSE: "What do you think is causing the palpitations?"     I don't know maybe anxiety is what my family is telling me. 9. CARDIAC HISTORY: "Do you have any history of heart disease?" (e.g., heart attack, angina, bypass surgery, angioplasty, arrhythmia)      No 10. OTHER SYMPTOMS: "Do you have any other symptoms?" (e.g., dizziness, chest pain, sweating, difficulty breathing)       Denies being dizzy or lightheaded.   Denies chest pain or feeling short of breath other than a "little winded but not much" 11. PREGNANCY: "Is there any chance you are pregnant?" "When was your last menstrual period?"       NA  Protocols used: Diabetes -  High Blood Sugar-A-AH, Heart Rate and Heartbeat Questions-A-AH

## 2022-02-02 NOTE — Telephone Encounter (Signed)
Will forward to provider  

## 2022-02-03 ENCOUNTER — Encounter (INDEPENDENT_AMBULATORY_CARE_PROVIDER_SITE_OTHER): Payer: Self-pay | Admitting: Primary Care

## 2022-02-03 ENCOUNTER — Ambulatory Visit (INDEPENDENT_AMBULATORY_CARE_PROVIDER_SITE_OTHER): Payer: Self-pay | Admitting: Primary Care

## 2022-02-03 VITALS — BP 148/92 | HR 110 | Resp 16 | Ht 67.0 in | Wt 233.2 lb

## 2022-02-03 DIAGNOSIS — Z1159 Encounter for screening for other viral diseases: Secondary | ICD-10-CM

## 2022-02-03 DIAGNOSIS — Z6836 Body mass index (BMI) 36.0-36.9, adult: Secondary | ICD-10-CM

## 2022-02-03 DIAGNOSIS — I10 Essential (primary) hypertension: Secondary | ICD-10-CM

## 2022-02-03 DIAGNOSIS — E782 Mixed hyperlipidemia: Secondary | ICD-10-CM

## 2022-02-03 DIAGNOSIS — E6609 Other obesity due to excess calories: Secondary | ICD-10-CM

## 2022-02-03 DIAGNOSIS — E119 Type 2 diabetes mellitus without complications: Secondary | ICD-10-CM

## 2022-02-03 LAB — POCT GLYCOSYLATED HEMOGLOBIN (HGB A1C): HbA1c, POC (controlled diabetic range): 5.7 % (ref 0.0–7.0)

## 2022-02-03 LAB — GLUCOSE, POCT (MANUAL RESULT ENTRY): POC Glucose: 111 mg/dl — AB (ref 70–99)

## 2022-02-03 MED ORDER — LISINOPRIL 20 MG PO TABS: 20.0000 mg | ORAL_TABLET | Freq: Every day | ORAL | 3 refills | Status: DC

## 2022-02-03 MED ORDER — HYDROCHLOROTHIAZIDE 12.5 MG PO TABS: ORAL_TABLET | ORAL | 1 refills | Status: DC

## 2022-02-03 NOTE — Patient Instructions (Signed)
Hypertension, Adult High blood pressure (hypertension) is when the force of blood pumping through the arteries is too strong. The arteries are the blood vessels that carry blood from the heart throughout the body. Hypertension forces the heart to work harder to pump blood and may cause arteries to become narrow or stiff. Untreated or uncontrolled hypertension can lead to a heart attack, heart failure, a stroke, kidney disease, and other problems. A blood pressure reading consists of a higher number over a lower number. Ideally, your blood pressure should be below 120/80. The first ("top") number is called the systolic pressure. It is a measure of the pressure in your arteries as your heart beats. The second ("bottom") number is called the diastolic pressure. It is a measure of the pressure in your arteries as the heart relaxes. What are the causes? The exact cause of this condition is not known. There are some conditions that result in high blood pressure. What increases the risk? Certain factors may make you more likely to develop high blood pressure. Some of these risk factors are under your control, including: Smoking. Not getting enough exercise or physical activity. Being overweight. Having too much fat, sugar, calories, or salt (sodium) in your diet. Drinking too much alcohol. Other risk factors include: Having a personal history of heart disease, diabetes, high cholesterol, or kidney disease. Stress. Having a family history of high blood pressure and high cholesterol. Having obstructive sleep apnea. Age. The risk increases with age. What are the signs or symptoms? High blood pressure may not cause symptoms. Very high blood pressure (hypertensive crisis) may cause: Headache. Fast or irregular heartbeats (palpitations). Shortness of breath. Nosebleed. Nausea and vomiting. Vision changes. Severe chest pain, dizziness, and seizures. How is this diagnosed? This condition is diagnosed by  measuring your blood pressure while you are seated, with your arm resting on a flat surface, your legs uncrossed, and your feet flat on the floor. The cuff of the blood pressure monitor will be placed directly against the skin of your upper arm at the level of your heart. Blood pressure should be measured at least twice using the same arm. Certain conditions can cause a difference in blood pressure between your right and left arms. If you have a high blood pressure reading during one visit or you have normal blood pressure with other risk factors, you may be asked to: Return on a different day to have your blood pressure checked again. Monitor your blood pressure at home for 1 week or longer. If you are diagnosed with hypertension, you may have other blood or imaging tests to help your health care provider understand your overall risk for other conditions. How is this treated? This condition is treated by making healthy lifestyle changes, such as eating healthy foods, exercising more, and reducing your alcohol intake. You may be referred for counseling on a healthy diet and physical activity. Your health care provider may prescribe medicine if lifestyle changes are not enough to get your blood pressure under control and if: Your systolic blood pressure is above 130. Your diastolic blood pressure is above 80. Your personal target blood pressure may vary depending on your medical conditions, your age, and other factors. Follow these instructions at home: Eating and drinking  Eat a diet that is high in fiber and potassium, and low in sodium, added sugar, and fat. An example of this eating plan is called the DASH diet. DASH stands for Dietary Approaches to Stop Hypertension. To eat this way: Eat   plenty of fresh fruits and vegetables. Try to fill one half of your plate at each meal with fruits and vegetables. Eat whole grains, such as whole-wheat pasta, brown rice, or whole-grain bread. Fill about one  fourth of your plate with whole grains. Eat or drink low-fat dairy products, such as skim milk or low-fat yogurt. Avoid fatty cuts of meat, processed or cured meats, and poultry with skin. Fill about one fourth of your plate with lean proteins, such as fish, chicken without skin, beans, eggs, or tofu. Avoid pre-made and processed foods. These tend to be higher in sodium, added sugar, and fat. Reduce your daily sodium intake. Many people with hypertension should eat less than 1,500 mg of sodium a day. Do not drink alcohol if: Your health care provider tells you not to drink. You are pregnant, may be pregnant, or are planning to become pregnant. If you drink alcohol: Limit how much you have to: 0-1 drink a day for women. 0-2 drinks a day for men. Know how much alcohol is in your drink. In the [place] one drink equals one 12 oz bottle of beer (355 mL), one 5 oz glass of wine (148 mL), or one 1 oz glass of hard liquor (44 mL). Lifestyle  Work with your health care provider to maintain a healthy body weight or to lose weight. Ask what an ideal weight is for you. Get at least 30 minutes of exercise that causes your heart to beat faster (aerobic exercise) most days of the week. Activities may include walking, swimming, or biking. Include exercise to strengthen your muscles (resistance exercise), such as Pilates or lifting weights, as part of your weekly exercise routine. Try to do these types of exercises for 30 minutes at least 3 days a week. Do not use any products that contain nicotine or tobacco. These products include cigarettes, chewing tobacco, and vaping devices, such as e-cigarettes. If you need help quitting, ask your health care provider. Monitor your blood pressure at home as told by your health care provider. Keep all follow-up visits. This is important. Medicines Take over-the-counter and prescription medicines only as told by your health care provider. Follow directions carefully. Blood  pressure medicines must be taken as prescribed. Do not skip doses of blood pressure medicine. Doing this puts you at risk for problems and can make the medicine less effective. Ask your health care provider about side effects or reactions to medicines that you should watch for. Contact a health care provider if you: Think you are having a reaction to a medicine you are taking. Have headaches that keep coming back (recurring). Feel dizzy. Have swelling in your ankles. Have trouble with your vision. Get help right away if you: Develop a severe headache or confusion. Have unusual weakness or numbness. Feel faint. Have severe pain in your chest or abdomen. Vomit repeatedly. Have trouble breathing. These symptoms may be an emergency. Get help right away. Call 911. Do not wait to see if the symptoms will go away. Do not drive yourself to the hospital. Summary Hypertension is when the force of blood pumping through your arteries is too strong. If this condition is not controlled, it may put you at risk for serious complications. Your personal target blood pressure may vary depending on your medical conditions, your age, and other factors. For most people, a normal blood pressure is less than 120/80. Hypertension is treated with lifestyle changes, medicines, or a combination of both. Lifestyle changes include losing weight, eating a healthy,   low-sodium diet, exercising more, and limiting alcohol. This information is not intended to replace advice given to you by your health care provider. Make sure you discuss any questions you have with your health care provider. Document Revised: 02/02/2021 Document Reviewed: 02/02/2021 Elsevier Patient Education  2023 [employer]  

## 2022-02-04 LAB — SPECIMEN STATUS REPORT

## 2022-02-04 LAB — MICROALBUMIN / CREATININE URINE RATIO

## 2022-02-05 LAB — MICROALBUMIN / CREATININE URINE RATIO
Creatinine, Urine: 413 mg/dL
Microalb/Creat Ratio: 322 mg/g creat — ABNORMAL HIGH (ref 0–29)
Microalbumin, Urine: 1328.3 ug/mL

## 2022-02-05 LAB — SPECIMEN STATUS REPORT

## 2022-02-06 NOTE — Progress Notes (Signed)
Established Patient Office Visit  Subjective   Patient ID: Ian Carrillo, male    DOB: [birth year]  Age: 26 y.o. MRN: [id number]  Chief Complaint  Patient presents with   Diabetes   Hypertension   Anxiety    Diabetes  Hypertension Associated symptoms include anxiety.  Anxiety   Mr.  Ian Carrillo is a 26 year old obese male who presents for diabetes management he denies polyuria, polydipsia, polyphagia, or vision changes.  Also the management of hypertension-.Patient has No headache, No chest pain, No abdominal pain - No Nausea, No new weakness tingling or numbness, No Cough - shortness of breath  education, and management.  Notes a 10 pound weight gain since last visit.  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  He also voices concerns with having anxiety episodes not able to identify when or what the causes are related to this feeling.   ROS Comprehensive ROS Pertinent positive and negative noted in HPI     Objective:     BP (!) 148/92   Pulse (!) 110   Resp 16   Ht _0  (1.702 m)   Wt 233 lb 3.2 oz (105.8 kg)   SpO2 95%   BMI 36.52 kg/m  BP Readings from Last 3 Encounters:  02/03/22 (!) 148/92  10/28/20 123/71  08/27/20 115/76      Physical Exam Constitutional:      Appearance: He is obese.  HENT:     Head: Normocephalic and atraumatic.     Right Ear: Tympanic membrane and external ear normal.     Left Ear: Tympanic membrane and external ear normal.     Nose: Nose normal.  Neck:     Comments: thick Cardiovascular:     Rate and Rhythm: Normal rate and regular rhythm.  Pulmonary:     Effort: Pulmonary effort is normal.     Breath sounds: Normal breath sounds.  Abdominal:     General: Bowel sounds are normal. There is distension.     Palpations: Abdomen is soft.  Musculoskeletal:        General:  Normal range of motion.     Cervical back: Normal range of motion and neck supple.  Skin:    General: Skin is warm and dry.  Neurological:     General: No focal deficit present.     Mental Status: He is alert and oriented to person, place, and time.  Psychiatric:        Mood and Affect: Mood normal.        Behavior: Behavior normal.        Thought Content: Thought content normal.        Judgment: Judgment normal.    Results for orders placed or performed in visit on 02/03/22  Microalbumin / creatinine urine ratio  Result Value Ref Range   Creatinine, Urine CANCELED mg/dL   Microalbumin, Urine CANCELED   CBC with Differential/Platelet  Result Value Ref Range   WBC 6.1 3.4 - 10.8 x10E3/uL   RBC 6.04 (H) 4.14 - 5.80 x10E6/uL   Hemoglobin 16.2 13.0 - 17.7 g/dL   Hematocrit 49.9 37.5 - 51.0 %   MCV 83 79 - 97 fL   MCH 26.8 26.6 - 33.0 pg   MCHC 32.5 31.5 - 35.7 g/dL   RDW 14.9 11.6 - 15.4 %   Platelets 256 150 -  450 x10E3/uL   Neutrophils 58 Not Estab. %   Lymphs 32 Not Estab. %   Monocytes 9 Not Estab. %   Eos 0 Not Estab. %   Basos 1 Not Estab. %   Neutrophils Absolute 3.5 1.4 - 7.0 x10E3/uL   Lymphocytes Absolute 2.0 0.7 - 3.1 x10E3/uL   Monocytes Absolute 0.5 0.1 - 0.9 x10E3/uL   EOS (ABSOLUTE) 0.0 0.0 - 0.4 x10E3/uL   Basophils Absolute 0.1 0.0 - 0.2 x10E3/uL   Immature Granulocytes 0 Not Estab. %   Immature Grans (Abs) 0.0 0.0 - 0.1 x10E3/uL  CMP14+EGFR  Result Value Ref Range   Glucose 90 70 - 99 mg/dL   BUN 13 6 - 20 mg/dL   Creatinine, Ser 1.01 0.76 - 1.27 mg/dL   eGFR 107 >59 mL/min/1.73   BUN/Creatinine Ratio 13 9 - 20   Sodium 143 134 - 144 mmol/L   Potassium 4.2 3.5 - 5.2 mmol/L   Chloride 98 96 - 106 mmol/L   CO2 24 20 - 29 mmol/L   Calcium 10.1 8.7 - 10.2 mg/dL   Total Protein 8.1 6.0 - 8.5 g/dL   Albumin 5.3 (H) 4.3 - 5.2 g/dL   Globulin, Total 2.8 1.5 - 4.5 g/dL   Albumin/Globulin Ratio 1.9 1.2 - 2.2   Bilirubin Total 1.7 (H) 0.0 - 1.2 mg/dL    Alkaline Phosphatase 104 44 - 121 IU/L   AST 116 (H) 0 - 40 IU/L   ALT 164 (H) 0 - 44 IU/L  HCV Ab w Reflex to Quant PCR  Result Value Ref Range   HCV Ab WILL FOLLOW   Lipid panel  Result Value Ref Range   Cholesterol, Total 278 (H) 100 - 199 mg/dL   Triglycerides 214 (H) 0 - 149 mg/dL   HDL 70 >39 mg/dL   VLDL Cholesterol Cal 39 5 - 40 mg/dL   LDL Chol Calc (NIH) 169 (H) 0 - 99 mg/dL   Chol/HDL Ratio 4.0 0.0 - 5.0 ratio  Specimen status report  Result Value Ref Range   specimen status report Comment   Microalbumin / creatinine urine ratio  Result Value Ref Range   Creatinine, Urine 413.0 Not Estab. mg/dL   Microalbumin, Urine 1,328.3 Not Estab. ug/mL   Microalb/Creat Ratio 322 (H) 0 - 29 mg/g creat  Specimen status report  Result Value Ref Range   specimen status report Comment   POCT glycosylated hemoglobin (Hb A1C)  Result Value Ref Range   Hemoglobin A1C     HbA1c POC (<> result, manual entry)     HbA1c, POC (prediabetic range)     HbA1c, POC (controlled diabetic range) 5.7 0.0 - 7.0 %  POCT glucose (manual entry)  Result Value Ref Range   POC Glucose 111 (A) 70 - 99 mg/dl    The ASCVD Risk score (Arnett DK, et al., 2019) failed to calculate for the following reasons:   The 2019 ASCVD risk score is only valid for ages 25 to 42    Assessment & Plan:  Ian Carrillo was seen today for diabetes, hypertension and anxiety.  Diagnoses and all orders for this visit:  Type 2 diabetes mellitus without complication, without long-term current use of insulin (HCC) -     POCT glycosylated hemoglobin (Hb A1C) -     POCT glucose (manual entry) -     Microalbumin / creatinine urine ratio -     CBC with Differential/Platelet  Essential hypertension BP goal - < 130/80 Explained that having  normal blood pressure is the goal and medications are helping to get to goal and maintain normal blood pressure. DIET: Limit salt intake, read nutrition labels to check salt content, limit fried  and high fatty foods  Avoid using multisymptom OTC cold preparations that generally contain sudafed which can rise BP. Consult with pharmacist on best cold relief products to use for persons with HTN EXERCISE Discussed incorporating exercise such as walking - 30 minutes most days of the week and can do in 10 minute intervals    -     CMP14+EGFR -     lisinopril (ZESTRIL) 20 MG tablet; Take 1 tablet (20 mg total) by mouth daily. -     hydrochlorothiazide (HYDRODIURIL) 12.5 MG tablet; Take 1 tablet daily  Mixed hyperlipidemia Discussed elevated cholesterol Increase risk of heart attack and/or stroke.  To reduce your Cholesterol , Remember - more fruits and vegetables, more fish, and limit red meat and dairy products.  More soy, nuts, beans, barley, lentils, oats and plant sterol ester enriched margarine instead of butter.  I also encourage eliminating sugar and processed food.  -     Lipid panel  Encounter for HCV screening test for low risk patient -     HCV Ab w Reflex to Quant PCR  Other orders -     Specimen status report -     Microalbumin / creatinine urine ratio -     Specimen status report          Return for first avaible Bp ck.    Kerin Perna, NP

## 2022-02-09 ENCOUNTER — Other Ambulatory Visit (INDEPENDENT_AMBULATORY_CARE_PROVIDER_SITE_OTHER): Payer: Self-pay | Admitting: Primary Care

## 2022-02-09 DIAGNOSIS — E782 Mixed hyperlipidemia: Secondary | ICD-10-CM

## 2022-02-09 MED ORDER — ATORVASTATIN CALCIUM 40 MG PO TABS: 40.0000 mg | ORAL_TABLET | Freq: Every day | ORAL | 3 refills | Status: DC

## 2022-02-10 ENCOUNTER — Encounter (INDEPENDENT_AMBULATORY_CARE_PROVIDER_SITE_OTHER): Payer: Self-pay

## 2022-02-17 ENCOUNTER — Encounter (INDEPENDENT_AMBULATORY_CARE_PROVIDER_SITE_OTHER): Payer: Self-pay

## 2022-02-17 LAB — LIPID PANEL
Chol/HDL Ratio: 4 ratio (ref 0.0–5.0)
Cholesterol, Total: 278 mg/dL — ABNORMAL HIGH (ref 100–199)
HDL: 70 mg/dL (ref >39)
LDL Chol Calc (NIH): 169 mg/dL — ABNORMAL HIGH (ref 0–99)
Triglycerides: 214 mg/dL — ABNORMAL HIGH (ref 0–149)
VLDL Cholesterol Cal: 39 mg/dL (ref 5–40)

## 2022-02-17 LAB — CMP14+EGFR
ALT: 164 IU/L — ABNORMAL HIGH (ref 0–44)
AST: 116 IU/L — ABNORMAL HIGH (ref 0–40)
Albumin/Globulin Ratio: 1.9 (ref 1.2–2.2)
Albumin: 5.3 g/dL — ABNORMAL HIGH (ref 4.3–5.2)
Alkaline Phosphatase: 104 IU/L (ref 44–121)
BUN/Creatinine Ratio: 13 (ref 9–20)
BUN: 13 mg/dL (ref 6–20)
Bilirubin Total: 1.7 mg/dL — ABNORMAL HIGH (ref 0.0–1.2)
CO2: 24 mmol/L (ref 20–29)
Calcium: 10.1 mg/dL (ref 8.7–10.2)
Chloride: 98 mmol/L (ref 96–106)
Creatinine, Ser: 1.01 mg/dL (ref 0.76–1.27)
Globulin, Total: 2.8 g/dL (ref 1.5–4.5)
Glucose: 90 mg/dL (ref 70–99)
Potassium: 4.2 mmol/L (ref 3.5–5.2)
Sodium: 143 mmol/L (ref 134–144)
Total Protein: 8.1 g/dL (ref 6.0–8.5)
eGFR: 107 mL/min/{1.73_m2} (ref >59)

## 2022-02-17 LAB — CBC WITH DIFFERENTIAL/PLATELET
Basophils Absolute: 0.1 10*3/uL (ref 0.0–0.2)
Basos: 1 %
EOS (ABSOLUTE): 0 10*3/uL (ref 0.0–0.4)
Eos: 0 %
Hematocrit: 49.9 % (ref 37.5–51.0)
Hemoglobin: 16.2 g/dL (ref 13.0–17.7)
Immature Grans (Abs): 0 10*3/uL (ref 0.0–0.1)
Immature Granulocytes: 0 %
Lymphocytes Absolute: 2 10*3/uL (ref 0.7–3.1)
Lymphs: 32 %
MCH: 26.8 pg (ref 26.6–33.0)
MCHC: 32.5 g/dL (ref 31.5–35.7)
MCV: 83 fL (ref 79–97)
Monocytes Absolute: 0.5 10*3/uL (ref 0.1–0.9)
Monocytes: 9 %
Neutrophils Absolute: 3.5 10*3/uL (ref 1.4–7.0)
Neutrophils: 58 %
Platelets: 256 10*3/uL (ref 150–450)
RBC: 6.04 x10E6/uL — ABNORMAL HIGH (ref 4.14–5.80)
RDW: 14.9 % (ref 11.6–15.4)
WBC: 6.1 10*3/uL (ref 3.4–10.8)

## 2022-02-17 LAB — HCV AB W REFLEX TO QUANT PCR

## 2022-03-07 ENCOUNTER — Ambulatory Visit (INDEPENDENT_AMBULATORY_CARE_PROVIDER_SITE_OTHER): Payer: Medicaid Other | Admitting: Primary Care

## 2022-03-07 ENCOUNTER — Ambulatory Visit (INDEPENDENT_AMBULATORY_CARE_PROVIDER_SITE_OTHER): Payer: Medicaid Other

## 2022-07-10 IMAGING — CR DG CHEST 2V
2 series · 2 of 2 positions shown · non-contrast
Comparison: None.

CLINICAL DATA: Chest pain.  Shortness of breath.

EXAM:
CHEST - 2 VIEW

[chest lat]
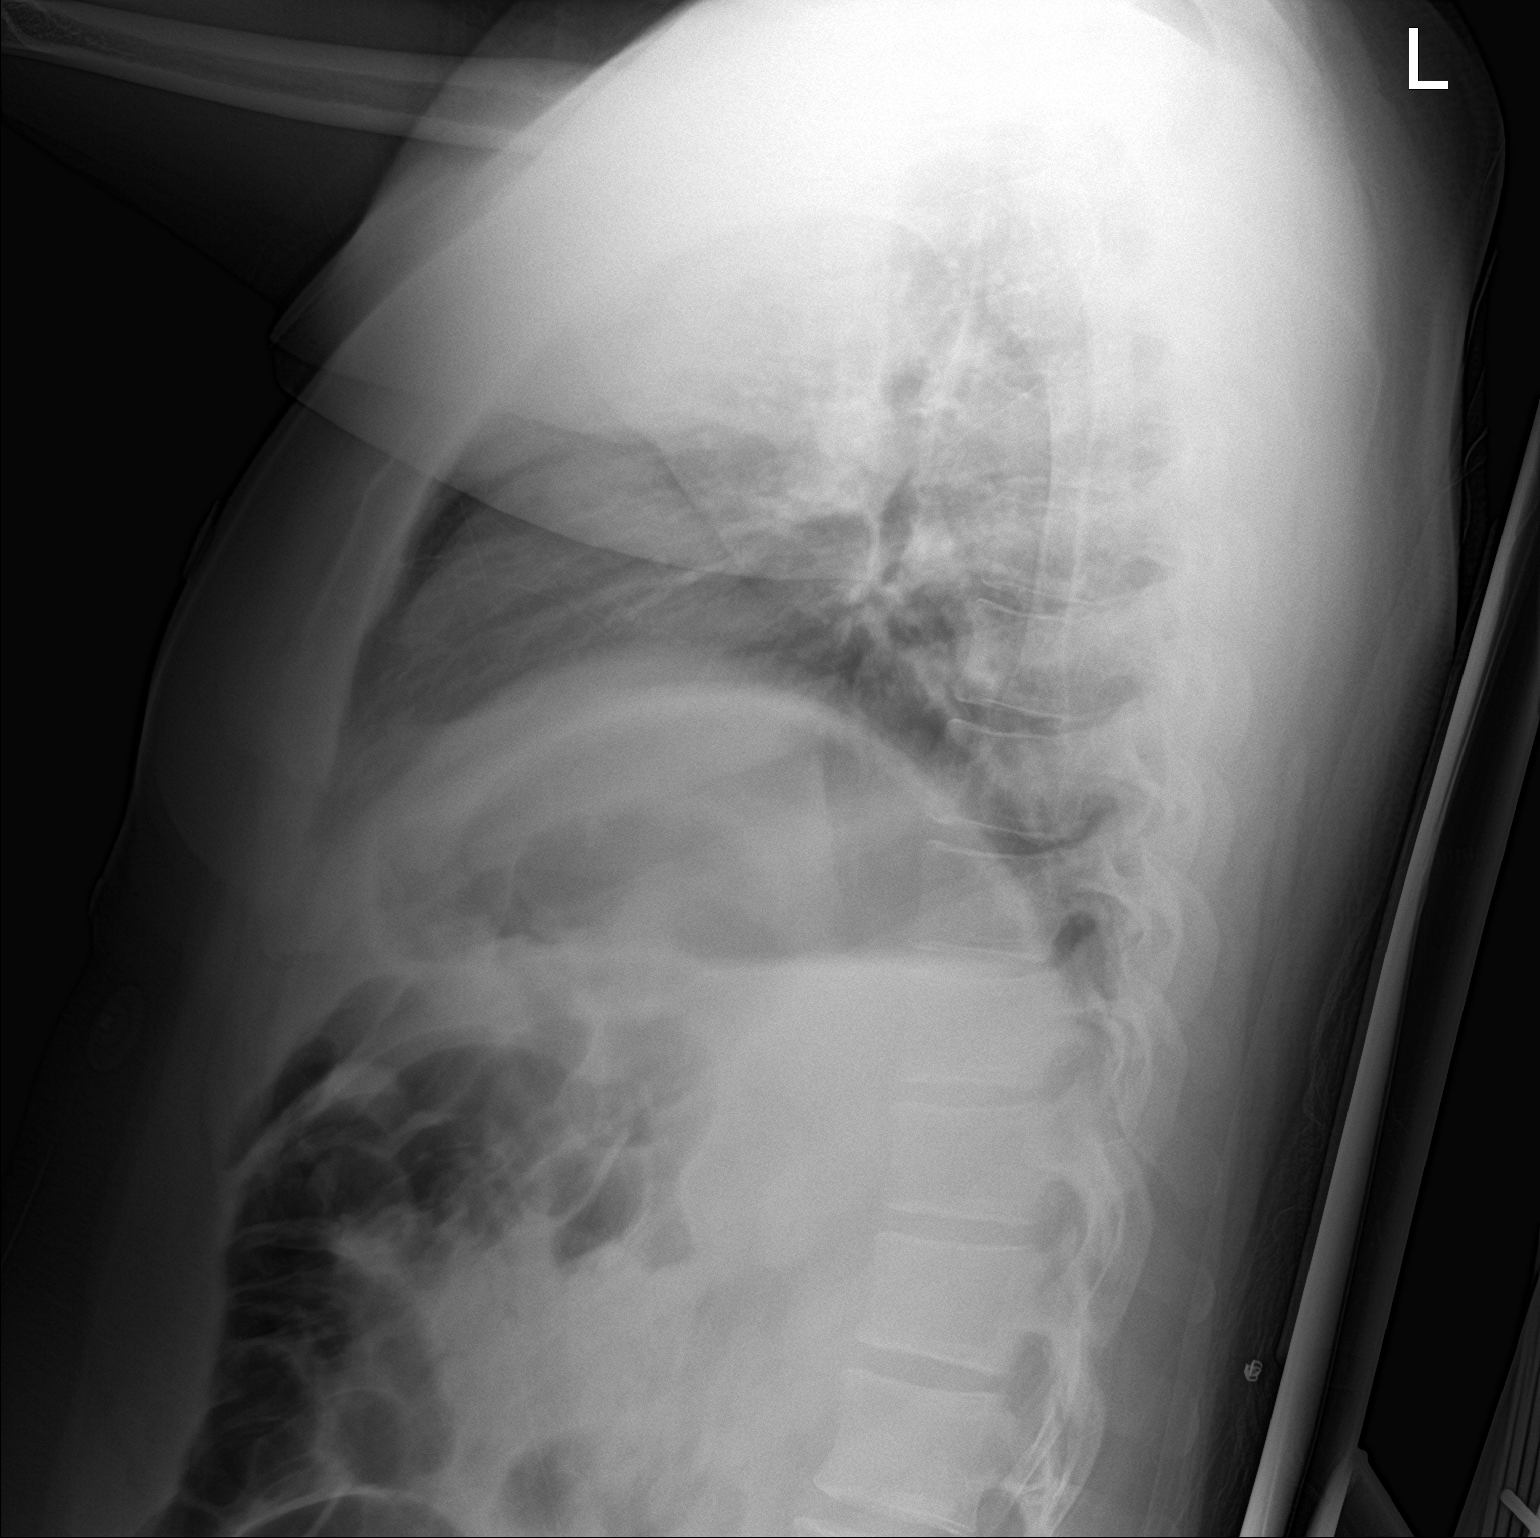

[chest ap]
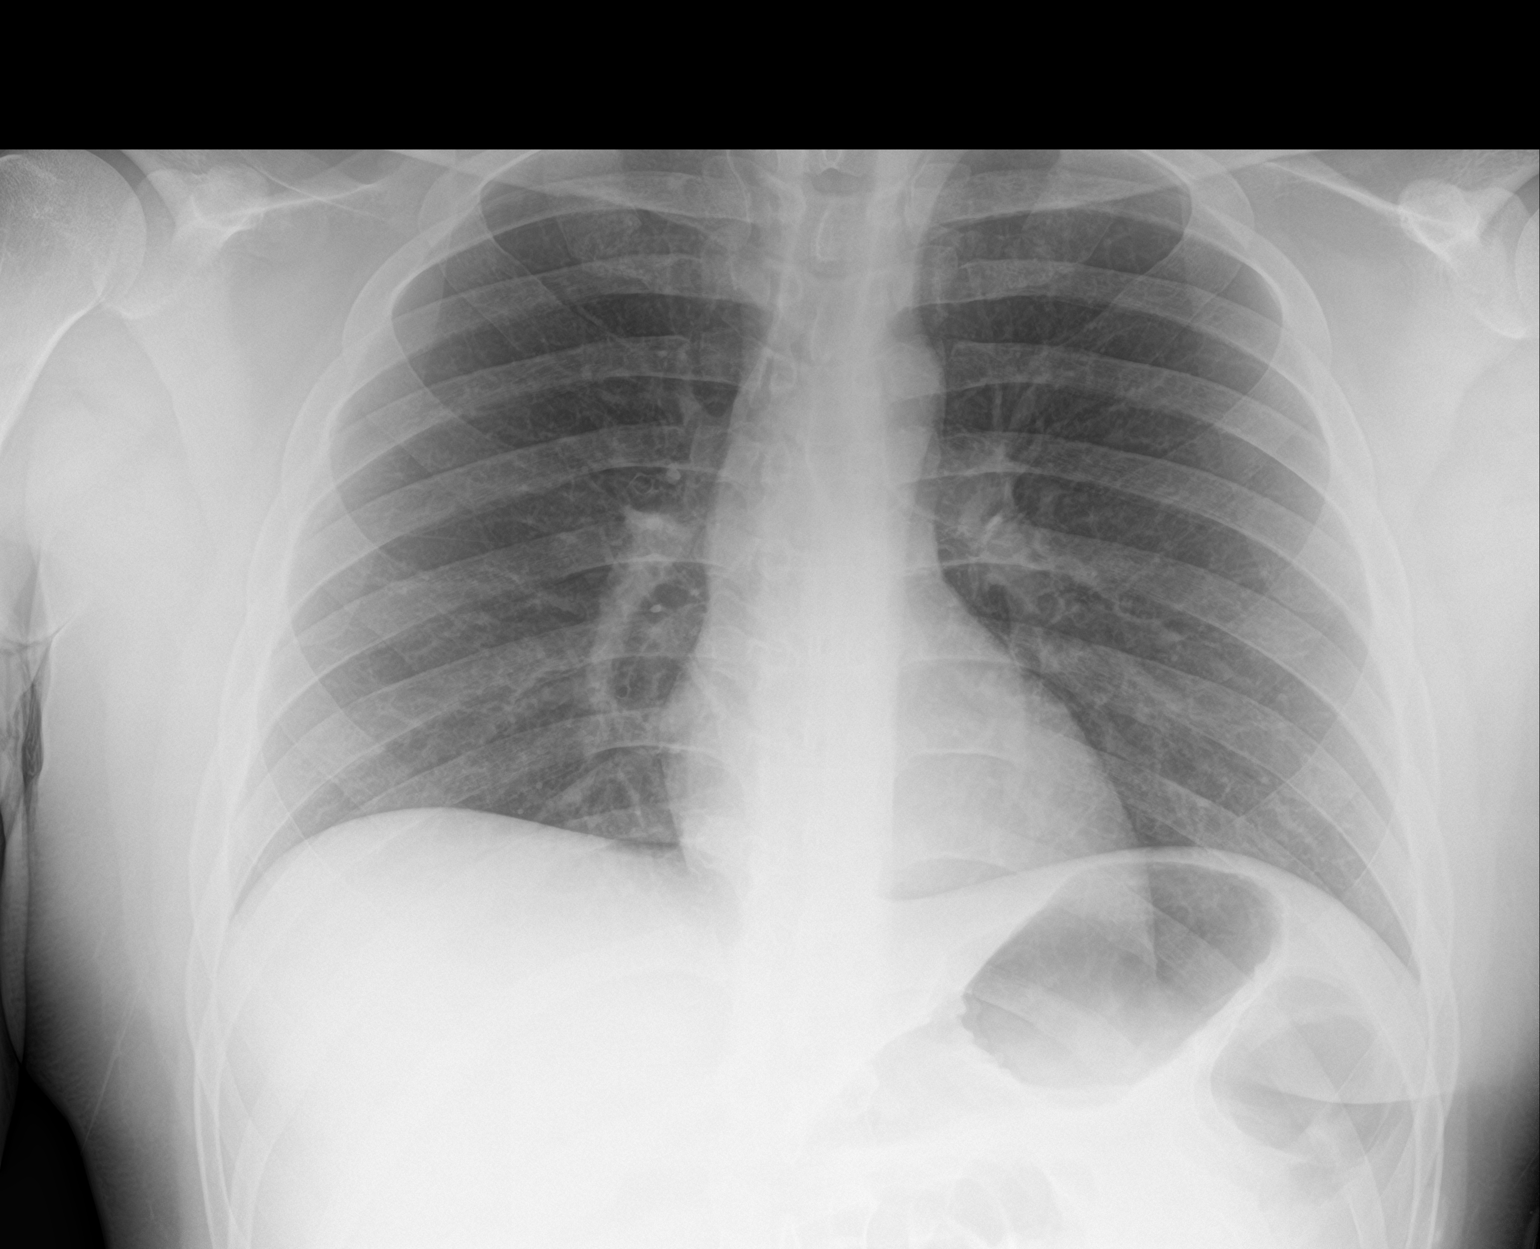

[2 of 2 positions shown; findings below may reference images not displayed]

FINDINGS: The heart size and mediastinal contours are within normal limits.
Both lungs are clear. No visible pleural effusions or pneumothorax.
No acute osseous abnormality.
IMPRESSION: No active cardiopulmonary disease.

## 2022-07-24 ENCOUNTER — Other Ambulatory Visit (INDEPENDENT_AMBULATORY_CARE_PROVIDER_SITE_OTHER): Payer: Self-pay | Admitting: Primary Care

## 2022-07-24 DIAGNOSIS — I10 Essential (primary) hypertension: Secondary | ICD-10-CM

## 2022-07-25 NOTE — Telephone Encounter (Signed)
Requested Prescriptions  Pending Prescriptions Disp Refills   hydrochlorothiazide (HYDRODIURIL) 12.5 MG tablet [Pharmacy Med Name: HYDROCHLOROTHIAZIDE 12.5MG  TABLETS] 90 tablet 1    Sig: TAKE 1 TABLET BY MOUTH DAILY     Cardiovascular: Diuretics - Thiazide Failed - 07/24/2022  6:19 AM      Failed - Last BP in normal range    BP Readings from Last 1 Encounters:  02/03/22 (!) 148/92         Passed - Cr in normal range and within 180 days    Creatinine, Ser  Date Value Ref Range Status  02/03/2022 1.01 0.76 - 1.27 mg/dL Final         Passed - K in normal range and within 180 days    Potassium  Date Value Ref Range Status  02/03/2022 4.2 3.5 - 5.2 mmol/L Final         Passed - Na in normal range and within 180 days    Sodium  Date Value Ref Range Status  02/03/2022 143 134 - 144 mmol/L Final         Passed - Valid encounter within last 6 months    Recent Outpatient Visits           5 months ago Type 2 diabetes mellitus without complication, without long-term current use of insulin (HCC)   [place] [facility] Grayce Sessions, NP   1 year ago Type 2 diabetes mellitus without complication, without long-term current use of insulin (HCC)   [place] [facility] [place], [place] L, RPH-CPP   1 year ago Type 2 diabetes mellitus without complication, without long-term current use of insulin [facility])   [place] [facility] [place], [place] L, RPH-CPP   1 year ago Type 2 diabetes mellitus without complication, with long-term current use of insulin (HCC)   [place] [facility] Grayce Sessions, NP   1 year ago Type 2 diabetes mellitus without complication, with long-term current use of insulin [facility])    [facility] Grayce Sessions, NP

## 2022-09-21 ENCOUNTER — Ambulatory Visit (INDEPENDENT_AMBULATORY_CARE_PROVIDER_SITE_OTHER): Payer: Medicaid Other | Admitting: Primary Care

## 2022-09-21 ENCOUNTER — Ambulatory Visit (INDEPENDENT_AMBULATORY_CARE_PROVIDER_SITE_OTHER): Payer: Self-pay | Admitting: *Deleted

## 2022-09-21 ENCOUNTER — Encounter (INDEPENDENT_AMBULATORY_CARE_PROVIDER_SITE_OTHER): Payer: Self-pay | Admitting: Primary Care

## 2022-09-21 VITALS — BP 118/76 | HR 127 | Resp 16 | Wt 211.6 lb

## 2022-09-21 DIAGNOSIS — R002 Palpitations: Secondary | ICD-10-CM

## 2022-09-21 DIAGNOSIS — E782 Mixed hyperlipidemia: Secondary | ICD-10-CM | POA: Diagnosis not present

## 2022-09-21 DIAGNOSIS — I10 Essential (primary) hypertension: Secondary | ICD-10-CM | POA: Diagnosis not present

## 2022-09-21 NOTE — Patient Instructions (Signed)
Gastroesophageal Reflux Disease, Adult Gastroesophageal reflux (GER) happens when acid from the stomach flows up into the tube that connects the mouth and the stomach (esophagus). Normally, food travels down the esophagus and stays in the stomach to be digested. However, when a person has GER, food and stomach acid sometimes move back up into the esophagus. If this becomes a more serious problem, the person may be diagnosed with a disease called gastroesophageal reflux disease (GERD). GERD occurs when the reflux: Happens often. Causes frequent or severe symptoms. Causes problems such as damage to the esophagus. When stomach acid comes in contact with the esophagus, the acid may cause inflammation in the esophagus. Over time, GERD may create small holes (ulcers) in the lining of the esophagus. What are the causes? This condition is caused by a problem with the muscle between the esophagus and the stomach (lower esophageal sphincter, or LES). Normally, the LES muscle closes after food passes through the esophagus to the stomach. When the LES is weakened or abnormal, it does not close properly, and that allows food and stomach acid to go back up into the esophagus. The LES can be weakened by certain dietary substances, medicines, and medical conditions, including: Tobacco use. Pregnancy. Having a hiatal hernia. Alcohol use. Certain foods and beverages, such as coffee, chocolate, onions, and peppermint. What increases the risk? You are more likely to develop this condition if you: Have an increased body weight. Have a connective tissue disorder. Take NSAIDs, such as ibuprofen. What are the signs or symptoms? Symptoms of this condition include: Heartburn. Difficult or painful swallowing and the feeling of having a lump in the throat. A bitter taste in the mouth. Bad breath and having a large amount of saliva. Having an upset or bloated stomach and belching. Chest pain. Different conditions can  cause chest pain. Make sure you see your health care provider if you experience chest pain. Shortness of breath or wheezing. Ongoing (chronic) cough or a nighttime cough. Wearing away of tooth enamel. Weight loss. How is this diagnosed? This condition may be diagnosed based on a medical history and a physical exam. To determine if you have mild or severe GERD, your health care provider may also monitor how you respond to treatment. You may also have tests, including: A test to examine your stomach and esophagus with a small camera (endoscopy). A test that measures the acidity level in your esophagus. A test that measures how much pressure is on your esophagus. A barium swallow or modified barium swallow test to show the shape, size, and functioning of your esophagus. How is this treated? Treatment for this condition may vary depending on how severe your symptoms are. Your health care provider may recommend: Changes to your diet. Medicine. Surgery. The goal of treatment is to help relieve your symptoms and to prevent complications. Follow these instructions at home: Eating and drinking  Follow a diet as recommended by your health care provider. This may involve avoiding foods and drinks such as: Coffee and tea, with or without caffeine. Drinks that contain alcohol. Energy drinks and sports drinks. Carbonated drinks or sodas. Chocolate and cocoa. Peppermint and mint flavorings. Garlic and onions. Horseradish. Spicy and acidic foods, including peppers, chili powder, curry powder, vinegar, hot sauces, and barbecue sauce. Citrus fruit juices and citrus fruits, such as oranges, lemons, and limes. Tomato-based foods, such as red sauce, chili, salsa, and pizza with red sauce. Fried and fatty foods, such as donuts, french fries, potato chips, and high-fat dressings.   High-fat meats, such as hot dogs and fatty cuts of red and white meats, such as rib eye steak, sausage, ham, and  bacon. High-fat dairy items, such as whole milk, butter, and cream cheese. Eat small, frequent meals instead of large meals. Avoid drinking large amounts of liquid with your meals. Avoid eating meals during the 2-3 hours before bedtime. Avoid lying down right after you eat. Do not exercise right after you eat. Lifestyle  Do not use any products that contain nicotine or tobacco. These products include cigarettes, chewing tobacco, and vaping devices, such as e-cigarettes. If you need help quitting, ask your health care provider. Try to reduce your stress by using methods such as yoga or meditation. If you need help reducing stress, ask your health care provider. If you are overweight, reduce your weight to an amount that is healthy for you. Ask your health care provider for guidance about a safe weight loss goal. General instructions Pay attention to any changes in your symptoms. Take over-the-counter and prescription medicines only as told by your health care provider. Do not take aspirin, ibuprofen, or other NSAIDs unless your health care provider told you to take these medicines. Wear loose-fitting clothing. Do not wear anything tight around your waist that causes pressure on your abdomen. Raise (elevate) the head of your bed about 6 inches (15 cm). You can use a wedge to do this. Avoid bending over if this makes your symptoms worse. Keep all follow-up visits. This is important. Contact a health care provider if: You have: New symptoms. Unexplained weight loss. Difficulty swallowing or it hurts to swallow. Wheezing or a persistent cough. A hoarse voice. Your symptoms do not improve with treatment. Get help right away if: You have sudden pain in your arms, neck, jaw, teeth, or back. You suddenly feel sweaty, dizzy, or light-headed. You have chest pain or shortness of breath. You vomit and the vomit is green, yellow, or black, or it looks like blood or coffee grounds. You faint. You  have stool that is red, bloody, or black. You cannot swallow, drink, or eat. These symptoms may represent a serious problem that is an emergency. Do not wait to see if the symptoms will go away. Get medical help right away. Call your local emergency services (911 in the [place] Do not drive yourself to the hospital. Summary Gastroesophageal reflux happens when acid from the stomach flows up into the esophagus. GERD is a disease in which the reflux happens often, causes frequent or severe symptoms, or causes problems such as damage to the esophagus. Treatment for this condition may vary depending on how severe your symptoms are. Your health care provider may recommend diet and lifestyle changes, medicine, or surgery. Contact a health care provider if you have new or worsening symptoms. Take over-the-counter and prescription medicines only as told by your health care provider. Do not take aspirin, ibuprofen, or other NSAIDs unless your health care provider told you to do so. Keep all follow-up visits as told by your health care provider. This is important. This information is not intended to replace advice given to you by your health care provider. Make sure you discuss any questions you have with your health care provider. Document Revised: 10/07/2019 Document Reviewed: 10/07/2019 Elsevier Patient Education  2024 [employer]  

## 2022-09-21 NOTE — Progress Notes (Signed)
   Acute Office Visit  Subjective:     Patient ID: Ian Carrillo, male    DOB: [birth year], 27 y.o.   MRN: [id number]  CC: feels heart racing   HPI Ian Carrillo is a 27 year old male. He is concern that he feels like his heart is racing and pain down his left arm and sharp pain in his back. He is unable to relate these s/s to a etiology. Doesn't eat pork, red meat , caffeine or soda since his last visit and Bp was elevated. Reviewed EKG- normal . Patient has No headache, No chest pain, No abdominal pain - No Nausea, No new weakness tingling or numbness, No Cough - shortness of breath . Lost 20 lbs since last visit .  ROS Comprehensive ROS Pertinent positive and negative noted in HPI       Objective:    Blood Pressure 118/76   Pulse (Abnormal) 127   Respiration 16   Weight 211 lb 9.6 oz (96 kg)   Oxygen Saturation 98%   Body Mass Index 33.14 kg/m    Physical Exam Vitals reviewed.  Constitutional:      Appearance: He is obese.  HENT:     Head: Normocephalic.     Right Ear: Tympanic membrane and external ear normal.     Left Ear: Tympanic membrane and external ear normal.     Nose: Nose normal.  Eyes:     Extraocular Movements: Extraocular movements intact.     Pupils: Pupils are equal, round, and reactive to light.  Cardiovascular:     Rate and Rhythm: Normal rate and regular rhythm.  Pulmonary:     Effort: Pulmonary effort is normal.     Breath sounds: Normal breath sounds.  Abdominal:     General: Bowel sounds are normal. There is distension.     Palpations: Abdomen is soft.  Musculoskeletal:        General: Normal range of motion.     Cervical back: Normal range of motion and neck supple.  Skin:    General: Skin is warm and dry.  Neurological:     Mental Status: He is alert and oriented to person, place, and time.  Psychiatric:        Mood and Affect: Mood normal.        Behavior: Behavior normal.        Thought Content: Thought content normal.   No  results found for any visits on 09/21/22.      Assessment & Plan:  Diagnoses and all orders for this visit:  Essential hypertension Self resolved diet and exercise   Mixed hyperlipidemia  Healthy lifestyle diet of fruits vegetables fish nuts whole grains and low saturated fat . Foods high in cholesterol or liver, fatty meats,cheese, butter avocados, nuts and seeds, chocolate and fried foods.  -     CMP14+EGFR; Future -     Lipid Panel; Future  Palpitations Unknown etiology- not related to any specific events denies stress and anxiety    Grayce Sessions, NP

## 2022-09-21 NOTE — Telephone Encounter (Signed)
  Chief Complaint: Chest "Pressure" Symptoms: Reports pressure , left sided "At rib area." States "Since beginning of year." Worsening past 3 days. States "Can't catch my breath at times." States radiates to upper back at times. Frequency: "Months" Pertinent Negatives: Patient denies States "Not pain, pressure at ribs." Disposition: [] ED /[] Urgent Care (no appt availability in office) / [x] Appointment(In office/virtual)/ []  [facility]/ [] Home Care/ [] Refused Recommended Disposition /[] [employer] [facility]/ []  Follow-up with PCP Additional Notes: Reports went to UC 2 months ago, EKG WNL. States  " Liver enzymes were up a little." Appt secured for today. Care   advise provided, pt verbalizes understanding. Reason for Disposition  [1] Chest pain lasts > 5 minutes AND [2] occurred > 3 days ago (72 hours) AND [3] NO chest pain or cardiac symptoms now  Answer Assessment - Initial Assessment Questions 1. LOCATION: "Where does it hurt?"       Left rib area 2. RADIATION: "Does the pain go anywhere else?" (e.g., into neck, jaw, arms, back)     Not pain, pressure, to upper back at times 3. ONSET: "When did the chest pain begin?" (Minutes, hours or days)      3 days. Off and on since January, worsening 4. PATTERN: "Does the pain come and go, or has it been constant since it started?"  "Does it get worse with exertion?"      Comes and goes 5. DURATION: "How long does it last" (e.g., seconds, minutes, hours)      6. SEVERITY: "How bad is the pain?"  (e.g., Scale 1-10; mild, moderate, or severe)    - MILD (1-3): doesn't interfere with normal activities     - MODERATE (4-7): interferes with normal activities or awakens from sleep    - SEVERE (8-10): excruciating pain, unable to do any normal activities       "Pressure" 2-3/10 7. CARDIAC RISK FACTORS: "Do you have any history of heart problems or risk factors for heart disease?" (e.g., angina, prior heart attack; diabetes, high blood  pressure, high cholesterol, smoker, or strong family history of heart disease)     no 8. PULMONARY RISK FACTORS: "Do you have any history of lung disease?"  (e.g., blood clots in lung, asthma, emphysema, birth control pills)     no 9. CAUSE: "What do you think is causing the chest pain?"     Maybe my meds? 10. OTHER SYMPTOMS: "Do you have any other symptoms?" (e.g., dizziness, nausea, vomiting, sweating, fever, difficulty breathing, cough)       Heart racing, "Can't catch breath" Fatigue yesterday. Monday heart raced "After eating rice."  Protocols used: Chest Pain-A-AH

## 2022-09-27 ENCOUNTER — Other Ambulatory Visit (INDEPENDENT_AMBULATORY_CARE_PROVIDER_SITE_OTHER): Payer: Medicaid Other

## 2022-09-27 DIAGNOSIS — E782 Mixed hyperlipidemia: Secondary | ICD-10-CM | POA: Diagnosis not present

## 2022-09-28 ENCOUNTER — Encounter (INDEPENDENT_AMBULATORY_CARE_PROVIDER_SITE_OTHER): Payer: Self-pay | Admitting: Primary Care

## 2022-09-28 LAB — CMP14+EGFR
ALT: 25 IU/L (ref 0–44)
AST: 31 IU/L (ref 0–40)
Albumin: 5 g/dL (ref 4.3–5.2)
Alkaline Phosphatase: 116 IU/L (ref 44–121)
BUN/Creatinine Ratio: 34 — ABNORMAL HIGH (ref 9–20)
BUN: 31 mg/dL — ABNORMAL HIGH (ref 6–20)
Bilirubin Total: 0.7 mg/dL (ref 0.0–1.2)
CO2: 25 mmol/L (ref 20–29)
Calcium: 10 mg/dL (ref 8.7–10.2)
Chloride: 98 mmol/L (ref 96–106)
Creatinine, Ser: 0.9 mg/dL (ref 0.76–1.27)
Globulin, Total: 2.3 g/dL (ref 1.5–4.5)
Glucose: 91 mg/dL (ref 70–99)
Potassium: 4.8 mmol/L (ref 3.5–5.2)
Sodium: 135 mmol/L (ref 134–144)
Total Protein: 7.3 g/dL (ref 6.0–8.5)
eGFR: 122 mL/min/{1.73_m2} (ref >59)

## 2022-09-28 LAB — LIPID PANEL
Chol/HDL Ratio: 4.6 ratio (ref 0.0–5.0)
Cholesterol, Total: 181 mg/dL (ref 100–199)
HDL: 39 mg/dL — ABNORMAL LOW (ref >39)
LDL Chol Calc (NIH): 127 mg/dL — ABNORMAL HIGH (ref 0–99)
Triglycerides: 78 mg/dL (ref 0–149)
VLDL Cholesterol Cal: 15 mg/dL (ref 5–40)

## 2022-09-30 ENCOUNTER — Other Ambulatory Visit (INDEPENDENT_AMBULATORY_CARE_PROVIDER_SITE_OTHER): Payer: Self-pay | Admitting: Primary Care

## 2022-09-30 ENCOUNTER — Telehealth: Payer: Self-pay | Admitting: Primary Care

## 2022-09-30 NOTE — Telephone Encounter (Signed)
Copied from CRM [id number]. Topic: General - Other >> Sep 29, 2022  3:48 PM Jannifer Rodney M wrote: Reason for CRM: Pt stated he has some questions about his lab results and requests call back to discuss.

## 2022-09-30 NOTE — Telephone Encounter (Signed)
Provider has spoken with pt and provided results

## 2023-01-02 ENCOUNTER — Telehealth (INDEPENDENT_AMBULATORY_CARE_PROVIDER_SITE_OTHER): Payer: Self-pay | Admitting: Primary Care

## 2023-01-02 ENCOUNTER — Ambulatory Visit (INDEPENDENT_AMBULATORY_CARE_PROVIDER_SITE_OTHER): Payer: Medicaid Other | Admitting: Primary Care

## 2023-02-10 NOTE — Telephone Encounter (Signed)
Called to see if pt was interested in doing a telephone visit since his apt was missed this morning at 8:30a

## 2023-03-15 ENCOUNTER — Other Ambulatory Visit (INDEPENDENT_AMBULATORY_CARE_PROVIDER_SITE_OTHER): Payer: Self-pay | Admitting: Primary Care

## 2023-03-15 DIAGNOSIS — I10 Essential (primary) hypertension: Secondary | ICD-10-CM

## 2023-03-15 NOTE — Telephone Encounter (Signed)
Last OV was 09/21/22 and in note   Essential hypertension Self resolved diet and exercise   Will forward to provider for refills if appropriate

## 2023-03-16 NOTE — Telephone Encounter (Signed)
Need appt/labs

## 2023-03-16 NOTE — Telephone Encounter (Signed)
Will forward to [facility] to contact and schedule

## 2023-03-24 ENCOUNTER — Telehealth (INDEPENDENT_AMBULATORY_CARE_PROVIDER_SITE_OTHER): Payer: Self-pay | Admitting: Primary Care

## 2023-03-24 NOTE — Telephone Encounter (Signed)
Called pt to schedule apt with them. Please advise if pt does call back.
